# Patient Record
Sex: Female | Born: 1955 | Race: White | Hispanic: No | Marital: Married | Smoking: Never smoker
Health system: Southern US, Community
[De-identification: ages and names within clinical notes are randomized; demographics above are authoritative.]

## PROBLEM LIST (undated history)

## (undated) DIAGNOSIS — H3530 Unspecified macular degeneration: Secondary | ICD-10-CM

## (undated) DIAGNOSIS — F32A Depression, unspecified: Secondary | ICD-10-CM

## (undated) DIAGNOSIS — M1990 Unspecified osteoarthritis, unspecified site: Secondary | ICD-10-CM

## (undated) DIAGNOSIS — H269 Unspecified cataract: Secondary | ICD-10-CM

## (undated) DIAGNOSIS — T7840XA Allergy, unspecified, initial encounter: Secondary | ICD-10-CM

## (undated) DIAGNOSIS — K219 Gastro-esophageal reflux disease without esophagitis: Secondary | ICD-10-CM

## (undated) DIAGNOSIS — Z8619 Personal history of other infectious and parasitic diseases: Secondary | ICD-10-CM

## (undated) HISTORY — DX: Unspecified macular degeneration: H35.30

## (undated) HISTORY — DX: Unspecified cataract: H26.9

## (undated) HISTORY — DX: Depression, unspecified: F32.A

## (undated) HISTORY — PX: EYE SURGERY: SHX253

## (undated) HISTORY — DX: Personal history of other infectious and parasitic diseases: Z86.19

## (undated) HISTORY — DX: Gastro-esophageal reflux disease without esophagitis: K21.9

## (undated) HISTORY — DX: Allergy, unspecified, initial encounter: T78.40XA

## (undated) HISTORY — DX: Unspecified osteoarthritis, unspecified site: M19.90

---

## 1993-03-10 HISTORY — PX: OTHER SURGICAL HISTORY: SHX169

## 1997-03-10 HISTORY — PX: HERNIA REPAIR: SHX51

## 2007-03-11 HISTORY — PX: THROAT SURGERY: SHX803

## 2014-03-10 DIAGNOSIS — H3530 Unspecified macular degeneration: Secondary | ICD-10-CM

## 2014-03-10 HISTORY — DX: Unspecified macular degeneration: H35.30

## 2014-07-14 LAB — HM COLONOSCOPY

## 2014-11-23 ENCOUNTER — Encounter: Payer: Self-pay | Admitting: Physician Assistant

## 2014-12-06 ENCOUNTER — Encounter: Payer: Self-pay | Admitting: Physician Assistant

## 2015-07-25 ENCOUNTER — Encounter: Payer: Self-pay | Admitting: Physician Assistant

## 2016-08-21 DIAGNOSIS — Z13220 Encounter for screening for lipoid disorders: Secondary | ICD-10-CM | POA: Diagnosis not present

## 2016-08-21 DIAGNOSIS — R350 Frequency of micturition: Secondary | ICD-10-CM | POA: Diagnosis not present

## 2016-08-21 DIAGNOSIS — Z0000 Encounter for general adult medical examination without abnormal findings: Secondary | ICD-10-CM | POA: Diagnosis not present

## 2016-08-21 LAB — BASIC METABOLIC PANEL
BUN: 13 (ref 4–21)
Creatinine: 0.8 (ref 0.5–1.1)
GLUCOSE: 93
Potassium: 4.2 (ref 3.4–5.3)
SODIUM: 143 (ref 137–147)

## 2016-08-21 LAB — HEPATIC FUNCTION PANEL
ALT: 13 (ref 7–35)
AST: 19 (ref 13–35)
Alkaline Phosphatase: 66 (ref 25–125)
BILIRUBIN, TOTAL: 0.6

## 2016-08-21 LAB — LIPID PANEL
Cholesterol: 229 — AB (ref 0–200)
HDL: 76 — AB (ref 35–70)
LDL CALC: 135
TRIGLYCERIDES: 88 (ref 40–160)

## 2016-08-21 LAB — TSH: TSH: 1 (ref 0.41–5.90)

## 2016-08-21 LAB — CBC AND DIFFERENTIAL
HEMATOCRIT: 41 (ref 36–46)
HEMOGLOBIN: 13.8 (ref 12.0–16.0)
Platelets: 239 (ref 150–399)
WBC: 11.2

## 2017-03-18 DIAGNOSIS — L219 Seborrheic dermatitis, unspecified: Secondary | ICD-10-CM | POA: Diagnosis not present

## 2017-07-16 ENCOUNTER — Encounter: Payer: Self-pay | Admitting: *Deleted

## 2017-07-16 ENCOUNTER — Ambulatory Visit: Payer: BLUE CROSS/BLUE SHIELD | Admitting: Physician Assistant

## 2017-07-16 ENCOUNTER — Encounter: Payer: Self-pay | Admitting: Physician Assistant

## 2017-07-16 VITALS — BP 130/80 | HR 66 | Temp 98.6°F | Ht 60.0 in | Wt 131.0 lb

## 2017-07-16 DIAGNOSIS — J302 Other seasonal allergic rhinitis: Secondary | ICD-10-CM | POA: Diagnosis not present

## 2017-07-16 DIAGNOSIS — Z7689 Persons encountering health services in other specified circumstances: Secondary | ICD-10-CM

## 2017-07-16 MED ORDER — FLUTICASONE PROPIONATE 50 MCG/ACT NA SUSP: 2.0000 | Freq: Every day | NASAL | 2 refills | Status: DC

## 2017-07-16 NOTE — Progress Notes (Signed)
Robin Stevens is a 62 y.o. female here to Establish Care.  I acted as a Education administrator for [employer], PA-C Anselmo Pickler, LPN  History of Present Illness:   Chief Complaint  Patient presents with  . Establish Care    BC/BS  . Rash and welts    off and on since Saturday    Acute Concerns: Rash -- developed rash over the weekend, was out in her yard and had hives on her bilateral arms and torso. Started an antihistamine which is helping -- taking claritin daily. Rash no longer present. Does continue to have some itching in nose/throat/ears. Denies tongue/lip swelling, SOB or difficulty swallowing.  Health Maintenance: Immunizations -- requesting records Colonoscopy -- done 2017 Normal per pt Mammogram -- done 2017 Normal per pt PAP -- most recent 2016 Bone Density -- has not had Weight -- Weight: 131 lb (59.4 kg)   Depression screen PHQ 2/9 07/16/2017  Decreased Interest 0  Down, Depressed, Hopeless 0  PHQ - 2 Score 0    No flowsheet data found.  Other providers/specialists: Dr. Macarthur Critchley -- eye Dr. Wyline Beady -- dentist  Past Medical History:  Diagnosis Date  . GERD (gastroesophageal reflux disease)   . History of chicken pox   . Macular degeneration of both eyes 2016     Social History   Socioeconomic History  . Marital status: Married    Spouse name: Not on file  . Number of children: Not on file  . Years of education: Not on file  . Highest education level: Not on file  Occupational History  . Not on file  Social Needs  . Financial resource strain: Not on file  . Food insecurity:    Worry: Not on file    Inability: Not on file  . Transportation needs:    Medical: Not on file    Non-medical: Not on file  Tobacco Use  . Smoking status: Never Smoker  . Smokeless tobacco: Never Used  Substance and Sexual Activity  . Alcohol use: Yes    Comment: socially  . Drug use: Never  . Sexual activity: Yes  Lifestyle  . Physical activity:    Days per week: Not  on file    Minutes per session: Not on file  . Stress: Not on file  Relationships  . Social connections:    Talks on phone: Not on file    Gets together: Not on file    Attends religious service: Not on file    Active member of club or organization: Not on file    Attends meetings of clubs or organizations: Not on file    Relationship status: Not on file  . Intimate partner violence:    Fear of current or ex partner: Not on file    Emotionally abused: Not on file    Physically abused: Not on file    Forced sexual activity: Not on file  Other Topics Concern  . 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 HERNIA REPAIR Bilateral 1999   Inguinal   . removal of ovarian cyst Right 1995  . 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 VAGINAL DELIVERY  1985, 1987, 1989    Family History  Problem Relation Age of Onset  .  Arthritis Mother   . Heart attack Mother   . Heart disease Mother   . Pancreatic cancer Father   . Early death Maternal Grandmother 40       cardiac-related    Allergies  Allergen Reactions  . Sulfa Antibiotics Nausea Only     Current Medications:   Current Outpatient Medications:  .  B Complex-C (SUPER B COMPLEX PO), Take 1 tablet by mouth daily., Disp: , Rfl:  .  calcium citrate-vitamin D (CITRACAL+D) 315-200 MG-UNIT tablet, Take 1 tablet by mouth daily., Disp: , Rfl:  .  Cranberry 450 MG TABS, Take 1 tablet by mouth every other day., Disp: , Rfl:  .  Dietary Management Product (TOZAL PO), Take 1 tablet by mouth daily., Disp: , Rfl:  .  GLUCOSAMINE HCL PO, Take 1 tablet by mouth 2 (two) times daily., Disp: , Rfl:  .  ibuprofen (ADVIL,MOTRIN) 200 MG tablet, Take 200 mg by mouth as needed., Disp: , Rfl:  .  loratadine (CLARITIN) 10 MG tablet, Take 10 mg by mouth daily., Disp: ,  Rfl:  .  Magnesium 400 MG TABS, Take 1 tablet by mouth daily., Disp: , Rfl:  .  fluticasone (FLONASE) 50 MCG/ACT nasal spray, Place 2 sprays into both nostrils daily., Disp: 16 g, Rfl: 2   Review of Systems:   Review of Systems  Constitutional: Negative.  Negative for chills, fever, malaise/fatigue and weight loss.  HENT: Negative.  Negative for hearing loss, sinus pain and sore throat.   Eyes: Negative.  Negative for blurred vision.  Respiratory: Negative.  Negative for cough and shortness of breath.   Cardiovascular: Negative.  Negative for chest pain, palpitations and leg swelling.  Gastrointestinal: Positive for heartburn. Negative for abdominal pain, constipation, diarrhea, nausea and vomiting.  Genitourinary: Negative.  Negative for dysuria, frequency and urgency.  Musculoskeletal: Negative.  Negative for back pain, myalgias and neck pain.  Skin: Positive for itching and rash.  Neurological: Negative.  Negative for dizziness, tingling, seizures, loss of consciousness and headaches.  Endo/Heme/Allergies: Negative.  Negative for polydipsia.  Psychiatric/Behavioral: Negative.  Negative for depression. The patient is not nervous/anxious.     Vitals:   Vitals:   07/16/17 0812  BP: 130/80  Pulse: 66  Temp: 98.6 F (37 C)  TempSrc: Oral  SpO2: 99%  Weight: 131 lb (59.4 kg)  Height: 5' (1.524 m)     Body mass index is 25.58 kg/m.  Physical Exam:   Physical Exam  Constitutional: She appears well-developed. She is cooperative.  Non-toxic appearance. She does not have a sickly appearance. She does not appear ill. No distress.  HENT:  Head: Normocephalic and atraumatic.  Right Ear: Tympanic membrane, external ear and ear canal normal. Tympanic membrane is not erythematous, not retracted and not bulging.  Left Ear: Tympanic membrane, external ear and ear canal normal. Tympanic membrane is not erythematous, not retracted and not bulging.  Nose: Mucosal edema and rhinorrhea  present. Right sinus exhibits no maxillary sinus tenderness and no frontal sinus tenderness. Left sinus exhibits no maxillary sinus tenderness and no frontal sinus tenderness.  Mouth/Throat: Uvula is midline and mucous membranes are normal. No posterior oropharyngeal edema or posterior oropharyngeal erythema. Tonsils are 0 on the right. Tonsils are 0 on the left.  Eyes: Conjunctivae and lids are normal.  Neck: Trachea normal.  Cardiovascular: Normal rate, regular rhythm, S1 normal, S2 normal and normal heart sounds.  Pulmonary/Chest: Effort normal and breath sounds normal. She has no decreased breath sounds. She  has no wheezes. She has no rhonchi. She has no rales.  Lymphadenopathy:    She has no cervical adenopathy.  Neurological: She is alert.  Skin: Skin is warm, dry and intact.  No evidence of rash present  Psychiatric: She has a normal mood and affect. Her speech is normal and behavior is normal.  Nursing note and vitals reviewed.   Assessment and Plan:    Daniella was seen today for establish care and rash and welts.  Diagnoses and all orders for this visit:  Encounter to establish care Follow-up for physical at her convenience.  Seasonal allergies No red flags on exam. Continue daily antihistamine of choice. Start scheduled AM and PM flonase. Follow-up if symptoms worsen or persist despite treatment.  Other orders -     fluticasone (FLONASE) 50 MCG/ACT nasal spray; Place 2 sprays into both nostrils daily.    . Reviewed expectations re: course of current medical issues. . Discussed self-management of symptoms. . Outlined signs and symptoms indicating need for more acute intervention. . Patient verbalized understanding and all questions were answered. . See orders for this visit as documented in the electronic medical record. . Patient received an After-Visit Summary.   CMA or LPN served as scribe during this visit. History, Physical, and Plan performed by medical provider.  Documentation and orders reviewed and attested to.   Inda Coke, PA-C

## 2017-07-16 NOTE — Patient Instructions (Signed)
It was great to meet you!  Take antihistamine of your choice daily.  Use flonase in AM and PM.  Follow-up at your convenience for a physical.

## 2017-08-17 ENCOUNTER — Other Ambulatory Visit (HOSPITAL_COMMUNITY)
Admission: RE | Admit: 2017-08-17 | Discharge: 2017-08-17 | Disposition: A | Discharge status: Home / Self Care | Payer: BLUE CROSS/BLUE SHIELD | Source: Ambulatory Visit | Attending: Physician Assistant | Admitting: Physician Assistant

## 2017-08-17 ENCOUNTER — Encounter: Payer: Self-pay | Admitting: Physician Assistant

## 2017-08-17 ENCOUNTER — Ambulatory Visit (INDEPENDENT_AMBULATORY_CARE_PROVIDER_SITE_OTHER): Payer: BLUE CROSS/BLUE SHIELD | Admitting: Physician Assistant

## 2017-08-17 VITALS — BP 136/90 | HR 67 | Temp 98.0°F | Ht 60.0 in | Wt 128.2 lb

## 2017-08-17 DIAGNOSIS — Z114 Encounter for screening for human immunodeficiency virus [HIV]: Secondary | ICD-10-CM

## 2017-08-17 DIAGNOSIS — L84 Corns and callosities: Secondary | ICD-10-CM

## 2017-08-17 DIAGNOSIS — Z136 Encounter for screening for cardiovascular disorders: Secondary | ICD-10-CM

## 2017-08-17 DIAGNOSIS — M25542 Pain in joints of left hand: Secondary | ICD-10-CM | POA: Diagnosis not present

## 2017-08-17 DIAGNOSIS — Z1151 Encounter for screening for human papillomavirus (HPV): Secondary | ICD-10-CM | POA: Diagnosis not present

## 2017-08-17 DIAGNOSIS — Z13220 Encounter for screening for lipoid disorders: Secondary | ICD-10-CM

## 2017-08-17 DIAGNOSIS — Z124 Encounter for screening for malignant neoplasm of cervix: Secondary | ICD-10-CM

## 2017-08-17 DIAGNOSIS — Z0001 Encounter for general adult medical examination with abnormal findings: Secondary | ICD-10-CM | POA: Diagnosis not present

## 2017-08-17 DIAGNOSIS — M25541 Pain in joints of right hand: Secondary | ICD-10-CM

## 2017-08-17 DIAGNOSIS — Z1159 Encounter for screening for other viral diseases: Secondary | ICD-10-CM | POA: Diagnosis not present

## 2017-08-17 DIAGNOSIS — E559 Vitamin D deficiency, unspecified: Secondary | ICD-10-CM | POA: Diagnosis not present

## 2017-08-17 LAB — COMPREHENSIVE METABOLIC PANEL
ALBUMIN: 4.2 g/dL (ref 3.5–5.2)
ALK PHOS: 59 U/L (ref 39–117)
ALT: 12 U/L (ref 0–35)
AST: 20 U/L (ref 0–37)
BUN: 11 mg/dL (ref 6–23)
CALCIUM: 9.4 mg/dL (ref 8.4–10.5)
CHLORIDE: 104 meq/L (ref 96–112)
CO2: 31 mEq/L (ref 19–32)
Creatinine, Ser: 0.77 mg/dL (ref 0.40–1.20)
GFR: 81.13 mL/min (ref >60.00)
Glucose, Bld: 92 mg/dL (ref 70–99)
POTASSIUM: 4.1 meq/L (ref 3.5–5.1)
Sodium: 142 mEq/L (ref 135–145)
TOTAL PROTEIN: 6.8 g/dL (ref 6.0–8.3)
Total Bilirubin: 0.5 mg/dL (ref 0.2–1.2)

## 2017-08-17 LAB — VITAMIN D 25 HYDROXY (VIT D DEFICIENCY, FRACTURES): VITD: 34.6 ng/mL (ref 30.00–100.00)

## 2017-08-17 LAB — LIPID PANEL
CHOL/HDL RATIO: 3
Cholesterol: 240 mg/dL — ABNORMAL HIGH (ref 0–200)
HDL: 73.9 mg/dL (ref >39.00)
LDL CALC: 153 mg/dL — AB (ref 0–99)
NonHDL: 166.2
TRIGLYCERIDES: 65 mg/dL (ref 0.0–149.0)
VLDL: 13 mg/dL (ref 0.0–40.0)

## 2017-08-17 LAB — CBC WITH DIFFERENTIAL/PLATELET
BASOS PCT: 0.7 % (ref 0.0–3.0)
Basophils Absolute: 0 10*3/uL (ref 0.0–0.1)
EOS PCT: 2.1 % (ref 0.0–5.0)
Eosinophils Absolute: 0.1 10*3/uL (ref 0.0–0.7)
HEMATOCRIT: 41.3 % (ref 36.0–46.0)
HEMOGLOBIN: 13.7 g/dL (ref 12.0–15.0)
Lymphocytes Relative: 32.8 % (ref 12.0–46.0)
Lymphs Abs: 1.5 10*3/uL (ref 0.7–4.0)
MCHC: 33.1 g/dL (ref 30.0–36.0)
MCV: 87.6 fl (ref 78.0–100.0)
MONOS PCT: 9.2 % (ref 3.0–12.0)
Monocytes Absolute: 0.4 10*3/uL (ref 0.1–1.0)
Neutro Abs: 2.5 10*3/uL (ref 1.4–7.7)
Neutrophils Relative %: 55.2 % (ref 43.0–77.0)
Platelets: 247 10*3/uL (ref 150.0–400.0)
RBC: 4.72 Mil/uL (ref 3.87–5.11)
RDW: 13.8 % (ref 11.5–15.5)
WBC: 4.6 10*3/uL (ref 4.0–10.5)

## 2017-08-17 NOTE — Patient Instructions (Signed)
It was great to meet you!  Please let us know if you would like further work-up of your hand/thumb pain.  If a referral was placed today (podiatry), you will be contacted for an appointment. Please note that routine referrals can sometimes take up to 3-4 weeks to process. Please call our office if you haven't heard anything after this time frame.  After speaking with the physician, I do not think that your insurance will pay for a DEXA scan until age 61. If you would like to pay out of pocket for this, let me know.  Health Maintenance, Female Adopting a healthy lifestyle and getting preventive care can go a long way to promote health and wellness. Talk with your health care provider about what schedule of regular examinations is right for you. This is a good chance for you to check in with your provider about disease prevention and staying healthy. In between checkups, there are plenty of things you can do on your own. Experts have done a lot of research about which lifestyle changes and preventive measures are most likely to keep you healthy. Ask your health care provider for more information. Weight and diet Eat a healthy diet  Be sure to include plenty of vegetables, fruits, low-fat dairy products, and lean protein.  Do not eat a lot of foods high in solid fats, added sugars, or salt.  Get regular exercise. This is one of the most important things you can do for your health. ? Most adults should exercise for at least 150 minutes each week. The exercise should increase your heart rate and make you sweat (moderate-intensity exercise). ? Most adults should also do strengthening exercises at least twice a week. This is in addition to the moderate-intensity exercise.  Maintain a healthy weight  Body mass index (BMI) is a measurement that can be used to identify possible weight problems. It estimates body fat based on height and weight. Your health care provider can help determine your BMI and  help you achieve or maintain a healthy weight.  For females 55 years of age and older: ? A BMI below 18.5 is considered underweight. ? A BMI of 18.5 to 24.9 is normal. ? A BMI of 25 to 29.9 is considered overweight. ? A BMI of 30 and above is considered obese.  Watch levels of cholesterol and blood lipids  You should start having your blood tested for lipids and cholesterol at 61 years of age, then have this test every 5 years.  You may need to have your cholesterol levels checked more often if: ? Your lipid or cholesterol levels are high. ? You are older than 61 years of age. ? You are at high risk for heart disease.  Cancer screening Lung Cancer  Lung cancer screening is recommended for adults 75-68 years old who are at high risk for lung cancer because of a history of smoking.  A yearly low-dose CT scan of the lungs is recommended for people who: ? Currently smoke. ? Have quit within the past 15 years. ? Have at least a 30-pack-year history of smoking. A pack year is smoking an average of one pack of cigarettes a day for 1 year.  Yearly screening should continue until it has been 15 years since you quit.  Yearly screening should stop if you develop a health problem that would prevent you from having lung cancer treatment.  Breast Cancer  Practice breast self-awareness. This means understanding how your breasts normally appear and feel.  feel.  It also means doing regular breast self-exams. Let your health care provider know about any changes, no matter how small.  If you are in your 20s or 30s, you should have a clinical breast exam (CBE) by a health care provider every 1-3 years as part of a regular health exam.  If you are 40 or older, have a CBE every year. Also consider having a breast X-ray (mammogram) every year.  If you have a family history of breast cancer, talk to your health care provider about genetic screening.  If you are at high risk for breast cancer, talk to  your health care provider about having an MRI and a mammogram every year.  Breast cancer gene (BRCA) assessment is recommended for women who have family members with BRCA-related cancers. BRCA-related cancers include: ? Breast. ? Ovarian. ? Tubal. ? Peritoneal cancers.  Results of the assessment will determine the need for genetic counseling and BRCA1 and BRCA2 testing.  Cervical Cancer Your health care provider may recommend that you be screened regularly for cancer of the pelvic organs (ovaries, uterus, and vagina). This screening involves a pelvic examination, including checking for microscopic changes to the surface of your cervix (Pap test). You may be encouraged to have this screening done every 3 years, beginning at age 21.  For women ages 30-65, health care providers may recommend pelvic exams and Pap testing every 3 years, or they may recommend the Pap and pelvic exam, combined with testing for human papilloma virus (HPV), every 5 years. Some types of HPV increase your risk of cervical cancer. Testing for HPV may also be done on women of any age with unclear Pap test results.  Other health care providers may not recommend any screening for nonpregnant women who are considered low risk for pelvic cancer and who do not have symptoms. Ask your health care provider if a screening pelvic exam is right for you.  If you have had past treatment for cervical cancer or a condition that could lead to cancer, you need Pap tests and screening for cancer for at least 20 years after your treatment. If Pap tests have been discontinued, your risk factors (such as having a new sexual partner) need to be reassessed to determine if screening should resume. Some women have medical problems that increase the chance of getting cervical cancer. In these cases, your health care provider may recommend more frequent screening and Pap tests.  Colorectal Cancer  This type of cancer can be detected and often  prevented.  Routine colorectal cancer screening usually begins at 61 years of age and continues through 61 years of age.  Your health care provider may recommend screening at an earlier age if you have risk factors for colon cancer.  Your health care provider may also recommend using home test kits to check for hidden blood in the stool.  A small camera at the end of a tube can be used to examine your colon directly (sigmoidoscopy or colonoscopy). This is done to check for the earliest forms of colorectal cancer.  Routine screening usually begins at age 50.  Direct examination of the colon should be repeated every 5-10 years through 61 years of age. However, you may need to be screened more often if early forms of precancerous polyps or small growths are found.  Skin Cancer  Check your skin from head to toe regularly.  Tell your health care provider about any new moles or changes in moles, especially if there   change in a mole's shape or color.  Also tell your health care provider if you have a mole that is larger than the size of a pencil eraser.  Always use sunscreen. Apply sunscreen liberally and repeatedly throughout the day.  Protect yourself by wearing long sleeves, pants, a wide-brimmed hat, and sunglasses whenever you are outside.  Heart disease, diabetes, and high blood pressure  High blood pressure causes heart disease and increases the risk of stroke. High blood pressure is more likely to develop in: ? People who have blood pressure in the high end of the normal range (130-139/85-89 mm Hg). ? People who are overweight or obese. ? People who are African American.  If you are 79-13 years of age, have your blood pressure checked every 3-5 years. If you are 37 years of age or older, have your blood pressure checked every year. You should have your blood pressure measured twice-once when you are at a hospital or clinic, and once when you are not at a hospital or clinic.  Record the average of the two measurements. To check your blood pressure when you are not at a hospital or clinic, you can use: ? An automated blood pressure machine at a pharmacy. ? A home blood pressure monitor.  If you are between 71 years and 51 years old, ask your health care provider if you should take aspirin to prevent strokes.  Have regular diabetes screenings. This involves taking a blood sample to check your fasting blood sugar level. ? If you are at a normal weight and have a low risk for diabetes, have this test once every three years after 61 years of age. ? If you are overweight and have a high risk for diabetes, consider being tested at a younger age or more often. Preventing infection Hepatitis B  If you have a higher risk for hepatitis B, you should be screened for this virus. You are considered at high risk for hepatitis B if: ? You were born in a country where hepatitis B is common. Ask your health care provider which countries are considered high risk. ? Your parents were born in a high-risk country, and you have not been immunized against hepatitis B (hepatitis B vaccine). ? You have HIV or AIDS. ? You use needles to inject street drugs. ? You live with someone who has hepatitis B. ? You have had sex with someone who has hepatitis B. ? You get hemodialysis treatment. ? You take certain medicines for conditions, including cancer, organ transplantation, and autoimmune conditions.  Hepatitis C  Blood testing is recommended for: ? Everyone born from [birth year] through 1964. ? Anyone with known risk factors for hepatitis C.  Sexually transmitted infections (STIs)  You should be screened for sexually transmitted infections (STIs) including gonorrhea and chlamydia if: ? You are sexually active and are younger than 61 years of age. ? You are older than 61 years of age and your health care provider tells you that you are at risk for this type of infection. ? Your sexual  activity has changed since you were last screened and you are at an increased risk for chlamydia or gonorrhea. Ask your health care provider if you are at risk.  If you do not have HIV, but are at risk, it may be recommended that you take a prescription medicine daily to prevent HIV infection. This is called pre-exposure prophylaxis (PrEP). You are considered at risk if: ? You are sexually active and do not regularly  use condoms or know the HIV status of your partner(s). ? You take drugs by injection. ? You are sexually active with a partner who has HIV.  Talk with your health care provider about whether you are at high risk of being infected with HIV. If you choose to begin PrEP, you should first be tested for HIV. You should then be tested every 3 months for as long as you are taking PrEP. Pregnancy  If you are premenopausal and you may become pregnant, ask your health care provider about preconception counseling.  If you may become pregnant, take 400 to 800 micrograms (mcg) of folic acid every day.  If you want to prevent pregnancy, talk to your health care provider about birth control (contraception). Osteoporosis and menopause  Osteoporosis is a disease in which the bones lose minerals and strength with aging. This can result in serious bone fractures. Your risk for osteoporosis can be identified using a bone density scan.  If you are 8 years of age or older, or if you are at risk for osteoporosis and fractures, ask your health care provider if you should be screened.  Ask your health care provider whether you should take a calcium or vitamin D supplement to lower your risk for osteoporosis.  Menopause may have certain physical symptoms and risks.  Hormone replacement therapy may reduce some of these symptoms and risks. Talk to your health care provider about whether hormone replacement therapy is right for you. Follow these instructions at home:  Schedule regular health, dental,  and eye exams.  Stay current with your immunizations.  Do not use any tobacco products including cigarettes, chewing tobacco, or electronic cigarettes.  If you are pregnant, do not drink alcohol.  If you are breastfeeding, limit how much and how often you drink alcohol.  Limit alcohol intake to no more than 1 drink per day for nonpregnant women. One drink equals 12 ounces of beer, 5 ounces of wine, or 1 ounces of hard liquor.  Do not use street drugs.  Do not share needles.  Ask your health care provider for help if you need support or information about quitting drugs.  Tell your health care provider if you often feel depressed.  Tell your health care provider if you have ever been abused or do not feel safe at home. This information is not intended to replace advice given to you by your health care provider. Make sure you discuss any questions you have with your health care provider. Document Released: 09/09/2010 Document Revised: 08/02/2015 Document Reviewed: 11/28/2014 Elsevier Interactive Patient Education  [employer].

## 2017-08-17 NOTE — Progress Notes (Signed)
I acted as a Education administrator for [employer], PA-C Anselmo Pickler, LPN   Subjective:    Robin Stevens is a 62 y.o. female and is here for a comprehensive physical exam.   HPI  Health Maintenance Due  Topic Date Due  . Hepatitis C Screening  04-May-1955  . HIV Screening  02/03/1972  . PAP SMEAR  02/02/1978  . MAMMOGRAM  02/03/2007  . COLONOSCOPY  02/03/2007    Acute Concerns: Bilateral hand pain -- patient reports that she has had ongoing issues for a few years with pain in both of her hands, especially in the base of her thumbs. Does have a strong family history of RA. She has noticed some swollen joints as well, unable to get her wedding ring off due to swelling in her ring finger joint. Symptoms are not so severe that she feels like she needs to take medication. Corn on R foot -- has occasional pain, wore strappy sandals at her daughters wedding for the all day event and since then has had a deformity to her R pinky toe with occasional pain, is interested in seeing podiatry  Chronic Issues: Vitamin D deficiency -- no family history of osteopenia/osteoporosis; has not had any recent or history of multiple fractures. She takes calcium and vit D supplement daily. Also does weight bearing exercise.  Health Maintenance: Immunizations -- UTD Colonoscopy -- done 2017, will obtain records, normal per pt. due 2027 Mammogram -- done 2017 Normal per pt,  PAP -- done today, last was done 2016 normal per pt. No Hx of abnormal Bone Density -- Never Diet -- well balanced Caffeine intake -- 2 cups daily Sleep habits -- doesn't usually have any issues Exercise -- 3 mornings a week --> yoga; 2 mornings a week --> strength training; tries to walk daily at lunch Weight -- Weight: 128 lb 4 oz (58.2 kg)  Mood -- has had situational anxiety and depression; was on Lexapro at one point after MIL passed away Weight history: Wt Readings from Last 10 Encounters:  08/17/17 128 lb 4 oz (58.2 kg)  07/16/17  131 lb (59.4 kg)   No LMP recorded. Patient is postmenopausal. Alcohol use: 1 glass nightly Tobacco use: none  Depression screen PHQ 2/9 08/17/2017  Decreased Interest 0  Down, Depressed, Hopeless 0  PHQ - 2 Score 0    Other providers/specialists: UTD with dentist and eye doctor   PMHx, SurgHx, SocialHx, Medications, and Allergies were reviewed in the Visit Navigator and updated as appropriate.   Past Medical History:  Diagnosis Date  . GERD (gastroesophageal reflux disease)   . History of chicken pox   . Macular degeneration of both eyes 2016     Past Surgical History:  Procedure Laterality Date  . HERNIA REPAIR Bilateral 1999   Inguinal   . removal of ovarian cyst Right 1995  . 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 VAGINAL DELIVERY  1985, 1987, 1989     Family History  Problem Relation Age of Onset  . Arthritis Mother   . Heart attack Mother   . Heart disease Mother   . Pancreatic cancer Father   . Early death Maternal Grandmother 40       cardiac-related  . Heart disease Maternal Grandfather     Social History   Tobacco Use  . Smoking status: Never Smoker  . Smokeless tobacco: Never Used  Substance Use Topics  . Alcohol use: Yes    Comment: socially  .  Drug use: Never    Review of Systems:   Review of Systems  Constitutional: Negative.  Negative for chills, fever, malaise/fatigue and weight loss.  HENT: Negative.  Negative for hearing loss, sinus pain and sore throat.   Eyes: Negative.  Negative for blurred vision.  Respiratory: Negative.  Negative for cough and shortness of breath.   Cardiovascular: Negative.  Negative for chest pain, palpitations and leg swelling.  Gastrointestinal: Negative.  Negative for abdominal pain, constipation, diarrhea, heartburn, nausea and vomiting.  Genitourinary: Negative.  Negative for dysuria, frequency and urgency.  Musculoskeletal: Negative.  Negative for back pain, myalgias and neck pain.  Skin:  Negative.  Negative for itching and rash.  Neurological: Negative.  Negative for dizziness, tingling, seizures, loss of consciousness and headaches.  Endo/Heme/Allergies: Negative.  Negative for polydipsia.  Psychiatric/Behavioral: Negative.  Negative for depression. The patient is not nervous/anxious.     Objective:   BP 136/90 (BP Location: Left Arm, Patient Position: Sitting, Cuff Size: Normal)   Pulse 67   Temp 98 F (36.7 C) (Oral)   Ht 5' (1.524 m)   Wt 128 lb 4 oz (58.2 kg)   SpO2 97%   BMI 25.05 kg/m  Body mass index is 25.05 kg/m.   General Appearance:    Alert, cooperative, no distress, appears stated age  Head:    Normocephalic, without obvious abnormality, atraumatic  Eyes:    PERRL, conjunctiva/corneas clear, EOM's intact, fundi    benign, both eyes  Ears:    Normal TM's and external ear canals, both ears  Nose:   Nares normal, septum midline, mucosa normal, no drainage    or sinus tenderness  Throat:   Lips, mucosa, and tongue normal; teeth and gums normal  Neck:   Supple, symmetrical, trachea midline, no adenopathy;    thyroid:  no enlargement/tenderness/nodules; no carotid   bruit or JVD  Back:     Symmetric, no curvature, ROM normal, no CVA tenderness  Lungs:     Clear to auscultation bilaterally, respirations unlabored  Chest Wall:    No tenderness or deformity   Heart:    Regular rate and rhythm, S1 and S2 normal, no murmur, rub   or gallop  Breast Exam:    No tenderness, masses, or nipple abnormality  Abdomen:     Soft, non-tender, bowel sounds active all four quadrants,    no masses, no organomegaly  Genitalia:    Normal female without lesion, discharge or tenderness  Extremities:   Extremities normal, atraumatic, no cyanosis or edema;  hyperkeratotic papule with a central "core" on the lateral fifth toe of R foot; negative Finkelstein's bilaterally; nodular PIP joint of L ring finger -- nontender/non-erythematous/not warm  Pulses:   2+ and symmetric all  extremities  Skin:   Skin color, texture, turgor normal, no rashes or lesions  Lymph nodes:   Cervical, supraclavicular, and axillary nodes normal  Neurologic:   CNII-XII intact, normal strength, sensation and reflexes    Throughout; grip strength 5/5 bilaterally     Assessment/Plan:   Natascha was seen today for annual exam.  Diagnoses and all orders for this visit:  Encounter for general adult medical examination with abnormal findings Today patient counseled on age appropriate routine health concerns for screening and prevention, each reviewed and up to date or declined. Immunizations reviewed and up to date or declined. Labs ordered and reviewed. Risk factors for depression reviewed and negative. Hearing function and visual acuity are intact. ADLs screened and addressed as  needed. Functional ability and level of safety reviewed and appropriate. Education, counseling and referrals performed based on assessed risks today. Patient provided with a copy of personalized plan for preventive services.  Vitamin D deficiency Continue calcium and vit D supplement. Will check Vit D level today. Continue weight-bearing activities. She would like a DEXA -- I discussed with Dr. Briscoe Deutscher and after reviewing patient's information, I do not suspect that her insurance will cover this for her until 18. I offered to order for patient however I discussed that it would like cost money out of pocket, patient declined at this time. Will defer to age 60, or sooner should alternative circumstances arise. -     VITAMIN D 25 Hydroxy (Vit-D Deficiency, Fractures)  Arthralgia of both hands Discussed possible [facility] arthritis. Offered referral to [place], however she declined at this time. Offered autoimmune labs, however she declined at this time as well. Follow-up if symptoms worsen or persist or if she would like to pursue these options. -     CBC with Differential/Platelet -     Comprehensive metabolic  panel  Encounter for lipid screening for cardiovascular disease -     Lipid panel  Screening for HIV (human immunodeficiency virus) -     HIV antibody  Encounter for screening for other viral diseases -     Hepatitis C antibody  Corn or callus She would like a podiatry referral. I have put this in today.  Pap smear for cervical cancer screening Performed today. She declined STI testing. -     Cytology - PAP    Well Adult Exam: Labs ordered: Yes. Patient counseling was done. See below for items discussed. Discussed the patient's BMI.  The BMI BMI is in the acceptable range Follow up next physical in 1 year. Breast cancer screening: patient to schedule. Cervical cancer screening: performed today   Patient Counseling: [x]    Nutrition: Stressed importance of moderation in sodium/caffeine intake, saturated fat and cholesterol, caloric balance, sufficient intake of fresh fruits, vegetables, fiber, calcium, iron, and 1 mg of folate supplement per day (for females capable of pregnancy).  [x]    Stressed the importance of regular exercise.   [x]    Substance Abuse: Discussed cessation/primary prevention of tobacco, alcohol, or other drug use; driving or other dangerous activities under the influence; availability of treatment for abuse.   [x]    Injury prevention: Discussed safety belts, safety helmets, smoke detector, smoking near bedding or upholstery.   []    Sexuality: Discussed sexually transmitted diseases, partner selection, use of condoms, avoidance of unintended pregnancy  and contraceptive alternatives.  [x]    Dental health: Discussed importance of regular tooth brushing, flossing, and dental visits.  [x]    Health maintenance and immunizations reviewed. Please refer to Health maintenance section.   CMA or LPN served as scribe during this visit. History, Physical, and Plan performed by medical provider. Documentation and orders reviewed and attested to.   Inda Coke, PA-C [place]

## 2017-08-18 LAB — CYTOLOGY - PAP
Diagnosis: NEGATIVE
HPV: NOT DETECTED

## 2017-08-18 LAB — HIV ANTIBODY (ROUTINE TESTING W REFLEX): HIV 1&2 Ab, 4th Generation: NONREACTIVE

## 2017-08-18 LAB — HEPATITIS C ANTIBODY
HEP C AB: NONREACTIVE
SIGNAL TO CUT-OFF: 0.01 (ref <1.00)

## 2017-08-19 NOTE — Progress Notes (Signed)
Please call patient and let them know that most of the labwork that we completed came back normal, including kidney, liver, blood sugar, infection count, HIV, Hep C and vit D levels. PAP smear is normal.  Cholesterol is elevated. I don't have old records to see how this is trending. It is not at the point that we need to start medication. Continue to work on diet and exercise. We will re-check next year.

## 2017-08-20 ENCOUNTER — Telehealth: Payer: Self-pay | Admitting: Physician Assistant

## 2017-08-20 NOTE — Telephone Encounter (Signed)
Left message to return call- lab in result notes

## 2017-08-20 NOTE — Telephone Encounter (Signed)
Copied from [place] [phone] [phone]. Topic: Quick Communication - Lab Results >> Aug 19, 2017  9:55 AM Marian Sorrow, LPN wrote: Called patient to inform them of 08/17/2017 lab results. When patient returns call, triage nurse may disclose results.

## 2017-08-26 ENCOUNTER — Encounter: Payer: Self-pay | Admitting: Physician Assistant

## 2017-09-04 ENCOUNTER — Ambulatory Visit: Payer: BLUE CROSS/BLUE SHIELD | Admitting: Podiatry

## 2017-09-04 ENCOUNTER — Encounter: Payer: Self-pay | Admitting: Podiatry

## 2017-09-04 ENCOUNTER — Ambulatory Visit (INDEPENDENT_AMBULATORY_CARE_PROVIDER_SITE_OTHER): Payer: BLUE CROSS/BLUE SHIELD

## 2017-09-04 VITALS — BP 129/89 | HR 67

## 2017-09-04 DIAGNOSIS — M2041 Other hammer toe(s) (acquired), right foot: Secondary | ICD-10-CM

## 2017-09-04 DIAGNOSIS — D169 Benign neoplasm of bone and articular cartilage, unspecified: Secondary | ICD-10-CM | POA: Diagnosis not present

## 2017-09-04 NOTE — Progress Notes (Signed)
Subjective:   Patient ID: Robin Stevens, female   DOB: 62 y.o.   MRN: [id number]   HPI Patient presents with chronic lesion on the outside of the right fifth digit inside of the right fifth digit and states she had a tremor in the past with temporary results.  She never has had any bone surgery but states that she knows at one point she may have to have this toe   Review of Systems  All other systems reviewed and are negative.       Objective:  Physical Exam  Constitutional: She appears well-developed and well-nourished.  Cardiovascular: Intact distal pulses.  Pulmonary/Chest: Effort normal.  Musculoskeletal: Normal range of motion.  Neurological: She is alert.  Skin: Skin is warm.  Nursing note and vitals reviewed.   Neurovascular status found to be intact with muscle strength adequate range of motion within normal limits with patient found to have severe rotation fifth digit right with distal lateral keratotic lesion and lesion on the inside of the right fifth toe that is painful when pressed.  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  Today I applied padding to take pressure off the toe and explained the usage of this  X-ray indicates that there is significant rotation of the toe and some wiggling of the distal phalanx right fifth toe

## 2017-09-18 ENCOUNTER — Encounter: Payer: Self-pay | Admitting: Physician Assistant

## 2017-10-09 ENCOUNTER — Telehealth: Payer: Self-pay | Admitting: Physician Assistant

## 2017-10-09 NOTE — Telephone Encounter (Signed)
Our office received records for the patient via CD from [facility]. We are unable to view any of the information or print due to the format. I spoke with Butch Penny who advised that she would contact the patient to have [facility] send her records in hard copy (paper).   Shredding CD as Butch Penny requested.   [facility] number: [phone]

## 2017-10-12 NOTE — Telephone Encounter (Signed)
[facility] and spoke to Medical Records Dept and told her I received records on pt on a disk and we can not read disk I need actual paper records. She said she will send over records.

## 2017-11-27 ENCOUNTER — Other Ambulatory Visit: Payer: Self-pay | Admitting: Physician Assistant

## 2017-11-27 DIAGNOSIS — Z1231 Encounter for screening mammogram for malignant neoplasm of breast: Secondary | ICD-10-CM

## 2017-12-04 ENCOUNTER — Ambulatory Visit: Payer: BLUE CROSS/BLUE SHIELD

## 2017-12-09 ENCOUNTER — Ambulatory Visit (INDEPENDENT_AMBULATORY_CARE_PROVIDER_SITE_OTHER): Payer: BLUE CROSS/BLUE SHIELD

## 2017-12-09 DIAGNOSIS — Z1231 Encounter for screening mammogram for malignant neoplasm of breast: Secondary | ICD-10-CM

## 2018-05-11 ENCOUNTER — Encounter: Payer: Self-pay | Admitting: Physician Assistant

## 2018-08-23 ENCOUNTER — Encounter: Payer: Self-pay | Admitting: Physician Assistant

## 2018-08-23 ENCOUNTER — Other Ambulatory Visit: Payer: Self-pay

## 2018-08-23 ENCOUNTER — Ambulatory Visit (INDEPENDENT_AMBULATORY_CARE_PROVIDER_SITE_OTHER): Payer: BC Managed Care – PPO | Admitting: Physician Assistant

## 2018-08-23 VITALS — BP 122/80 | HR 65 | Temp 98.6°F | Ht 60.0 in | Wt 131.2 lb

## 2018-08-23 DIAGNOSIS — Z0000 Encounter for general adult medical examination without abnormal findings: Secondary | ICD-10-CM | POA: Diagnosis not present

## 2018-08-23 DIAGNOSIS — E559 Vitamin D deficiency, unspecified: Secondary | ICD-10-CM

## 2018-08-23 DIAGNOSIS — J3089 Other allergic rhinitis: Secondary | ICD-10-CM | POA: Diagnosis not present

## 2018-08-23 DIAGNOSIS — E785 Hyperlipidemia, unspecified: Secondary | ICD-10-CM | POA: Diagnosis not present

## 2018-08-23 LAB — COMPREHENSIVE METABOLIC PANEL
ALT: 11 U/L (ref 0–35)
AST: 21 U/L (ref 0–37)
Albumin: 4.2 g/dL (ref 3.5–5.2)
Alkaline Phosphatase: 59 U/L (ref 39–117)
BUN: 14 mg/dL (ref 6–23)
CO2: 28 mEq/L (ref 19–32)
Calcium: 9.1 mg/dL (ref 8.4–10.5)
Chloride: 107 mEq/L (ref 96–112)
Creatinine, Ser: 0.78 mg/dL (ref 0.40–1.20)
GFR: 74.95 mL/min (ref >60.00)
Glucose, Bld: 60 mg/dL — ABNORMAL LOW (ref 70–99)
Potassium: 4.3 mEq/L (ref 3.5–5.1)
Sodium: 143 mEq/L (ref 135–145)
Total Bilirubin: 0.4 mg/dL (ref 0.2–1.2)
Total Protein: 6.4 g/dL (ref 6.0–8.3)

## 2018-08-23 LAB — CBC WITH DIFFERENTIAL/PLATELET
Basophils Absolute: 0 10*3/uL (ref 0.0–0.1)
Basophils Relative: 0.6 % (ref 0.0–3.0)
Eosinophils Absolute: 0.1 10*3/uL (ref 0.0–0.7)
Eosinophils Relative: 1 % (ref 0.0–5.0)
HCT: 39.4 % (ref 36.0–46.0)
Hemoglobin: 12.8 g/dL (ref 12.0–15.0)
Lymphocytes Relative: 31.6 % (ref 12.0–46.0)
Lymphs Abs: 1.5 10*3/uL (ref 0.7–4.0)
MCHC: 32.5 g/dL (ref 30.0–36.0)
MCV: 85.5 fl (ref 78.0–100.0)
Monocytes Absolute: 0.5 10*3/uL (ref 0.1–1.0)
Monocytes Relative: 10.4 % (ref 3.0–12.0)
Neutro Abs: 2.8 10*3/uL (ref 1.4–7.7)
Neutrophils Relative %: 56.4 % (ref 43.0–77.0)
Platelets: 224 10*3/uL (ref 150.0–400.0)
RBC: 4.61 Mil/uL (ref 3.87–5.11)
RDW: 17.9 % — ABNORMAL HIGH (ref 11.5–15.5)
WBC: 4.9 10*3/uL (ref 4.0–10.5)

## 2018-08-23 LAB — LIPID PANEL
Cholesterol: 208 mg/dL — ABNORMAL HIGH (ref 0–200)
HDL: 87.4 mg/dL (ref >39.00)
LDL Cholesterol: 107 mg/dL — ABNORMAL HIGH (ref 0–99)
NonHDL: 120.7
Total CHOL/HDL Ratio: 2
Triglycerides: 68 mg/dL (ref 0.0–149.0)
VLDL: 13.6 mg/dL (ref 0.0–40.0)

## 2018-08-23 LAB — VITAMIN D 25 HYDROXY (VIT D DEFICIENCY, FRACTURES): VITD: 34.39 ng/mL (ref 30.00–100.00)

## 2018-08-23 NOTE — Progress Notes (Signed)
I acted as a Education administrator for [employer], PA-C Anselmo Pickler, LPN   Subjective:    Robin Stevens is a 63 y.o. female and is here for a comprehensive physical exam.   HPI  There are no preventive care reminders to display for this patient.  Acute Concerns: None  Chronic Issues: Hyperlipidemia -- never been on a statin. Strong family hx of heart disease. Due for recheck of lipids today. Allergic rhinitis -- has been on claritin 10 mg since last may. Feels like when she comes off of it she gets rash, poss urticaria.  Vit d deficiency -- takes supplement regularly.   Health Maintenance: Immunizations -- UTD Colonoscopy -- UTD, due 07/2024 Mammogram -- UTD, due 12/2018 PAP -- done 08/17/2017 NILM/Neg HPV, due 08/2020 Bone Density -- N/A Diet -- overall well balanced, enjoys cooking Caffeine intake -- two cups daily Sleep habits -- no issues Exercise -- daily exercise Weight -- Weight: 131 lb 4 oz (59.5 kg)  Mood -- overall good Weight history: Wt Readings from Last 10 Encounters:  08/23/18 131 lb 4 oz (59.5 kg)  08/17/17 128 lb 4 oz (58.2 kg)  07/16/17 131 lb (59.4 kg)   No LMP recorded. Patient is postmenopausal. Alcohol use: daily glass of wine with dinner Tobacco use: none  Depression screen PHQ 2/9 08/23/2018  Decreased Interest 0  Down, Depressed, Hopeless 0  PHQ - 2 Score 0     Other providers/specialists: Patient Care Team: Inda Coke, [place] as PCP - General (Physician Assistant)    PMHx, SurgHx, SocialHx, Medications, and Allergies were reviewed in the Visit Navigator and updated as appropriate.   Past Medical History:  Diagnosis Date  . GERD (gastroesophageal reflux disease)   . History of chicken pox   . Macular degeneration of both eyes 2016     Past Surgical History:  Procedure Laterality Date  . HERNIA REPAIR Bilateral 1999   Inguinal   . removal of ovarian cyst Right 1995  . 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 VAGINAL  DELIVERY  1985, 1987, 1989     Family History  Problem Relation Age of Onset  . Arthritis Mother   . Heart attack Mother   . Heart disease Mother   . Pancreatic cancer Father   . Early death Maternal Grandmother 40       cardiac-related  . Heart disease Maternal Grandfather     Social History   Tobacco Use  . Smoking status: Never Smoker  . Smokeless tobacco: Never Used  Substance Use Topics  . Alcohol use: Yes    Comment: socially  . Drug use: Never    Review of Systems:   Review of Systems  Constitutional: Negative for chills, fever, malaise/fatigue and weight loss.  HENT: Negative for hearing loss, sinus pain and sore throat.   Eyes: Negative for blurred vision.  Respiratory: Negative for cough and shortness of breath.   Cardiovascular: Negative for chest pain, palpitations and leg swelling.  Gastrointestinal: Negative for abdominal pain, constipation, diarrhea, heartburn, nausea and vomiting.  Genitourinary: Negative for dysuria, frequency and urgency.  Musculoskeletal: Negative for back pain, myalgias and neck pain.  Skin: Negative for itching and rash.  Neurological: Negative for dizziness, tingling, seizures, loss of consciousness and headaches.  Endo/Heme/Allergies: Negative for polydipsia.  Psychiatric/Behavioral: Negative for depression. The patient is not nervous/anxious.   All other systems reviewed and are negative.   Objective:   BP 122/80 (BP Location: Left Arm, Patient Position: Sitting,  Cuff Size: Normal)   Pulse 65   Temp 98.6 F (37 C) (Oral)   Ht 5' (1.524 m)   Wt 131 lb 4 oz (59.5 kg)   SpO2 99%   BMI 25.63 kg/m  Body mass index is 25.63 kg/m.   General Appearance:    Alert, cooperative, no distress, appears stated age  Head:    Normocephalic, without obvious abnormality, atraumatic  Eyes:    PERRL, conjunctiva/corneas clear, EOM's intact, fundi    benign, both eyes  Ears:    Normal TM's and external ear canals, both ears  Nose:    Nares normal, septum midline, mucosa normal, no drainage    or sinus tenderness  Throat:   Lips, mucosa, and tongue normal; teeth and gums normal  Neck:   Supple, symmetrical, trachea midline, no adenopathy;    thyroid:  no enlargement/tenderness/nodules; no carotid   bruit or JVD  Back:     Symmetric, no curvature, ROM normal, no CVA tenderness  Lungs:     Clear to auscultation bilaterally, respirations unlabored  Chest Wall:    No tenderness or deformity   Heart:    Regular rate and rhythm, S1 and S2 normal, no murmur, rub or gallop  Breast Exam:    No tenderness, masses, or nipple abnormality  Abdomen:     Soft, non-tender, bowel sounds active all four quadrants,    no masses, no organomegaly  Genitalia:    Deferred  Extremities:   Extremities normal, atraumatic, no cyanosis or edema  Pulses:   2+ and symmetric all extremities  Skin:   Skin color, texture, turgor normal, no rashes or lesions  Lymph nodes:   Cervical, supraclavicular, and axillary nodes normal  Neurologic:   CNII-XII intact, normal strength, sensation and reflexes    throughout     Assessment/Plan:   Robin Stevens was seen today for annual exam.  Diagnoses and all orders for this visit:  Routine physical examination Today patient counseled on age appropriate routine health concerns for screening and prevention, each reviewed and up to date or declined. Immunizations reviewed and up to date or declined. Labs ordered and reviewed. Risk factors for depression reviewed and negative. Hearing function and visual acuity are intact. ADLs screened and addressed as needed. Functional ability and level of safety reviewed and appropriate. Education, counseling and referrals performed based on assessed risks today. Patient provided with a copy of personalized plan for preventive services.  Vitamin D deficiency -     VITAMIN D 25 Hydroxy (Vit-D Deficiency, Fractures)  Hyperlipidemia, unspecified hyperlipidemia type -     CBC with  Differential/Platelet -     Comprehensive metabolic panel -     Lipid panel  Environmental and seasonal allergies Continue Claritin 10 mg daily, we discussed trialing without it. She states that she will, but monitor for return of possible urticaria. Low threshold to resume if helps her rash and symptoms.    Well Adult Exam: Labs ordered: Yes. Patient counseling was done. See below for items discussed. Discussed the patient's BMI.  The BMI is in the acceptable range Follow up in one year. Breast cancer screening: UTD. Cervical cancer screening: UTD   Patient Counseling: [x]    Nutrition: Stressed importance of moderation in sodium/caffeine intake, saturated fat and cholesterol, caloric balance, sufficient intake of fresh fruits, vegetables, fiber, calcium, iron, and 1 mg of folate supplement per day (for females capable of pregnancy).  [x]    Stressed the importance of regular exercise.   [x]   Substance Abuse: Discussed cessation/primary prevention of tobacco, alcohol, or other drug use; driving or other dangerous activities under the influence; availability of treatment for abuse.   [x]    Injury prevention: Discussed safety belts, safety helmets, smoke detector, smoking near bedding or upholstery.   [x]    Sexuality: Discussed sexually transmitted diseases, partner selection, use of condoms, avoidance of unintended pregnancy  and contraceptive alternatives.  [x]    Dental health: Discussed importance of regular tooth brushing, flossing, and dental visits.  [x]    Health maintenance and immunizations reviewed. Please refer to Health maintenance section.   CMA or LPN served as scribe during this visit. History, Physical, and Plan performed by medical provider. The above documentation has been reviewed and is accurate and complete.   Inda Coke, PA-C [place]

## 2018-08-23 NOTE — Patient Instructions (Addendum)
It was great to see you!  Please go to the lab for blood work.   Our office will call you with your results unless you have chosen to receive results via MyChart.  If your blood work is normal we will follow-up each year for physicals and as scheduled for chronic medical problems.  If anything is abnormal we will treat accordingly and get you in for a follow-up.  Take care,  [facility] Maintenance, Female Adopting a healthy lifestyle and getting preventive care can go a long way to promote health and wellness. Talk with your health care provider about what schedule of regular examinations is right for you. This is a good chance for you to check in with your provider about disease prevention and staying healthy. In between checkups, there are plenty of things you can do on your own. Experts have done a lot of research about which lifestyle changes and preventive measures are most likely to keep you healthy. Ask your health care provider for more information. Weight and diet Eat a healthy diet  Be sure to include plenty of vegetables, fruits, low-fat dairy products, and lean protein.  Do not eat a lot of foods high in solid fats, added sugars, or salt.  Get regular exercise. This is one of the most important things you can do for your health. ? Most adults should exercise for at least 150 minutes each week. The exercise should increase your heart rate and make you sweat (moderate-intensity exercise). ? Most adults should also do strengthening exercises at least twice a week. This is in addition to the moderate-intensity exercise. Maintain a healthy weight  Body mass index (BMI) is a measurement that can be used to identify possible weight problems. It estimates body fat based on height and weight. Your health care provider can help determine your BMI and help you achieve or maintain a healthy weight.  For females 51 years of age and older: ? A BMI below 18.5 is considered  underweight. ? A BMI of 18.5 to 24.9 is normal. ? A BMI of 25 to 29.9 is considered overweight. ? A BMI of 30 and above is considered obese. Watch levels of cholesterol and blood lipids  You should start having your blood tested for lipids and cholesterol at 62 years of age, then have this test every 5 years.  You may need to have your cholesterol levels checked more often if: ? Your lipid or cholesterol levels are high. ? You are older than 62 years of age. ? You are at high risk for heart disease. Cancer screening Lung Cancer  Lung cancer screening is recommended for adults 41-59 years old who are at high risk for lung cancer because of a history of smoking.  A yearly low-dose CT scan of the lungs is recommended for people who: ? Currently smoke. ? Have quit within the past 15 years. ? Have at least a 30-pack-year history of smoking. A pack year is smoking an average of one pack of cigarettes a day for 1 year.  Yearly screening should continue until it has been 15 years since you quit.  Yearly screening should stop if you develop a health problem that would prevent you from having lung cancer treatment. Breast Cancer  Practice breast self-awareness. This means understanding how your breasts normally appear and feel.  It also means doing regular breast self-exams. Let your health care provider know about any changes, no matter how small.  If you are in your  your 20s or 30s, you should have a clinical breast exam (CBE) by a health care provider every 1-3 years as part of a regular health exam.  If you are 40 or older, have a CBE every year. Also consider having a breast X-ray (mammogram) every year.  If you have a family history of breast cancer, talk to your health care provider about genetic screening.  If you are at high risk for breast cancer, talk to your health care provider about having an MRI and a mammogram every year.  Breast cancer gene (BRCA) assessment is recommended  for women who have family members with BRCA-related cancers. BRCA-related cancers include: ? Breast. ? Ovarian. ? Tubal. ? Peritoneal cancers.  Results of the assessment will determine the need for genetic counseling and BRCA1 and BRCA2 testing. Cervical Cancer Your health care provider may recommend that you be screened regularly for cancer of the pelvic organs (ovaries, uterus, and vagina). This screening involves a pelvic examination, including checking for microscopic changes to the surface of your cervix (Pap test). You may be encouraged to have this screening done every 3 years, beginning at age 21.  For women ages 30-65, health care providers may recommend pelvic exams and Pap testing every 3 years, or they may recommend the Pap and pelvic exam, combined with testing for human papilloma virus (HPV), every 5 years. Some types of HPV increase your risk of cervical cancer. Testing for HPV may also be done on women of any age with unclear Pap test results.  Other health care providers may not recommend any screening for nonpregnant women who are considered low risk for pelvic cancer and who do not have symptoms. Ask your health care provider if a screening pelvic exam is right for you.  If you have had past treatment for cervical cancer or a condition that could lead to cancer, you need Pap tests and screening for cancer for at least 20 years after your treatment. If Pap tests have been discontinued, your risk factors (such as having a new sexual partner) need to be reassessed to determine if screening should resume. Some women have medical problems that increase the chance of getting cervical cancer. In these cases, your health care provider may recommend more frequent screening and Pap tests. Colorectal Cancer  This type of cancer can be detected and often prevented.  Routine colorectal cancer screening usually begins at 62 years of age and continues through 62 years of age.  Your health  care provider may recommend screening at an earlier age if you have risk factors for colon cancer.  Your health care provider may also recommend using home test kits to check for hidden blood in the stool.  A small camera at the end of a tube can be used to examine your colon directly (sigmoidoscopy or colonoscopy). This is done to check for the earliest forms of colorectal cancer.  Routine screening usually begins at age 50.  Direct examination of the colon should be repeated every 5-10 years through 62 years of age. However, you may need to be screened more often if early forms of precancerous polyps or small growths are found. Skin Cancer  Check your skin from head to toe regularly.  Tell your health care provider about any new moles or changes in moles, especially if there is a change in a mole's shape or color.  Also tell your health care provider if you have a mole that is larger than the size of a pencil   Always use sunscreen. Apply sunscreen liberally and repeatedly throughout the day.  Protect yourself by wearing long sleeves, pants, a wide-brimmed hat, and sunglasses whenever you are outside. Heart disease, diabetes, and high blood pressure  High blood pressure causes heart disease and increases the risk of stroke. High blood pressure is more likely to develop in: ? People who have blood pressure in the high end of the normal range (130-139/85-89 mm Hg). ? People who are overweight or obese. ? People who are African American.  If you are 48-33 years of age, have your blood pressure checked every 3-5 years. If you are 1 years of age or older, have your blood pressure checked every year. You should have your blood pressure measured twice-once when you are at a hospital or clinic, and once when you are not at a hospital or clinic. Record the average of the two measurements. To check your blood pressure when you are not at a hospital or clinic, you can use: ? An automated  blood pressure machine at a pharmacy. ? A home blood pressure monitor.  If you are between 3 years and 75 years old, ask your health care provider if you should take aspirin to prevent strokes.  Have regular diabetes screenings. This involves taking a blood sample to check your fasting blood sugar level. ? If you are at a normal weight and have a low risk for diabetes, have this test once every three years after 62 years of age. ? If you are overweight and have a high risk for diabetes, consider being tested at a younger age or more often. Preventing infection Hepatitis B  If you have a higher risk for hepatitis B, you should be screened for this virus. You are considered at high risk for hepatitis B if: ? You were born in a country where hepatitis B is common. Ask your health care provider which countries are considered high risk. ? Your parents were born in a high-risk country, and you have not been immunized against hepatitis B (hepatitis B vaccine). ? You have HIV or AIDS. ? You use needles to inject street drugs. ? You live with someone who has hepatitis B. ? You have had sex with someone who has hepatitis B. ? You get hemodialysis treatment. ? You take certain medicines for conditions, including cancer, organ transplantation, and autoimmune conditions. Hepatitis C  Blood testing is recommended for: ? Everyone born from [birth year] through 1964. ? Anyone with known risk factors for hepatitis C. Sexually transmitted infections (STIs)  You should be screened for sexually transmitted infections (STIs) including gonorrhea and chlamydia if: ? You are sexually active and are younger than 62 years of age. ? You are older than 62 years of age and your health care provider tells you that you are at risk for this type of infection. ? Your sexual activity has changed since you were last screened and you are at an increased risk for chlamydia or gonorrhea. Ask your health care provider if you are at  risk.  If you do not have HIV, but are at risk, it may be recommended that you take a prescription medicine daily to prevent HIV infection. This is called pre-exposure prophylaxis (PrEP). You are considered at risk if: ? You are sexually active and do not regularly use condoms or know the HIV status of your partner(s). ? You take drugs by injection. ? You are sexually active with a partner who has HIV. Talk with your health care provider  about whether you are at high risk of being infected with HIV. If you choose to begin PrEP, you should first be tested for HIV. You should then be tested every 3 months for as long as you are taking PrEP. Pregnancy  If you are premenopausal and you may become pregnant, ask your health care provider about preconception counseling.  If you may become pregnant, take 400 to 800 micrograms (mcg) of folic acid every day.  If you want to prevent pregnancy, talk to your health care provider about birth control (contraception). Osteoporosis and menopause  Osteoporosis is a disease in which the bones lose minerals and strength with aging. This can result in serious bone fractures. Your risk for osteoporosis can be identified using a bone density scan.  If you are 68 years of age or older, or if you are at risk for osteoporosis and fractures, ask your health care provider if you should be screened.  Ask your health care provider whether you should take a calcium or vitamin D supplement to lower your risk for osteoporosis.  Menopause may have certain physical symptoms and risks.  Hormone replacement therapy may reduce some of these symptoms and risks. Talk to your health care provider about whether hormone replacement therapy is right for you. Follow these instructions at home:  Schedule regular health, dental, and eye exams.  Stay current with your immunizations.  Do not use any tobacco products including cigarettes, chewing tobacco, or electronic  cigarettes.  If you are pregnant, do not drink alcohol.  If you are breastfeeding, limit how much and how often you drink alcohol.  Limit alcohol intake to no more than 1 drink per day for nonpregnant women. One drink equals 12 ounces of beer, 5 ounces of wine, or 1 ounces of hard liquor.  Do not use street drugs.  Do not share needles.  Ask your health care provider for help if you need support or information about quitting drugs.  Tell your health care provider if you often feel depressed.  Tell your health care provider if you have ever been abused or do not feel safe at home. This information is not intended to replace advice given to you by your health care provider. Make sure you discuss any questions you have with your health care provider. Document Released: 09/09/2010 Document Revised: 08/02/2015 Document Reviewed: 11/28/2014 Elsevier Interactive Patient Education  2019 [employer].

## 2018-12-10 DIAGNOSIS — Z20828 Contact with and (suspected) exposure to other viral communicable diseases: Secondary | ICD-10-CM | POA: Diagnosis not present

## 2019-01-10 ENCOUNTER — Other Ambulatory Visit: Payer: Self-pay | Admitting: Physician Assistant

## 2019-01-10 DIAGNOSIS — Z1231 Encounter for screening mammogram for malignant neoplasm of breast: Secondary | ICD-10-CM

## 2019-01-27 ENCOUNTER — Ambulatory Visit (INDEPENDENT_AMBULATORY_CARE_PROVIDER_SITE_OTHER): Payer: BC Managed Care – PPO

## 2019-01-27 ENCOUNTER — Other Ambulatory Visit: Payer: Self-pay

## 2019-01-27 DIAGNOSIS — Z1231 Encounter for screening mammogram for malignant neoplasm of breast: Secondary | ICD-10-CM

## 2019-02-15 DIAGNOSIS — H353131 Nonexudative age-related macular degeneration, bilateral, early dry stage: Secondary | ICD-10-CM | POA: Diagnosis not present

## 2019-08-24 ENCOUNTER — Ambulatory Visit (INDEPENDENT_AMBULATORY_CARE_PROVIDER_SITE_OTHER): Payer: BC Managed Care – PPO | Admitting: Physician Assistant

## 2019-08-24 ENCOUNTER — Other Ambulatory Visit: Payer: Self-pay

## 2019-08-24 ENCOUNTER — Encounter: Payer: Self-pay | Admitting: Physician Assistant

## 2019-08-24 VITALS — BP 138/90 | HR 70 | Temp 98.2°F | Ht 60.0 in | Wt 129.0 lb

## 2019-08-24 DIAGNOSIS — E559 Vitamin D deficiency, unspecified: Secondary | ICD-10-CM

## 2019-08-24 DIAGNOSIS — R5383 Other fatigue: Secondary | ICD-10-CM

## 2019-08-24 DIAGNOSIS — J3089 Other allergic rhinitis: Secondary | ICD-10-CM

## 2019-08-24 DIAGNOSIS — Z136 Encounter for screening for cardiovascular disorders: Secondary | ICD-10-CM

## 2019-08-24 DIAGNOSIS — Z0000 Encounter for general adult medical examination without abnormal findings: Secondary | ICD-10-CM

## 2019-08-24 DIAGNOSIS — Z13220 Encounter for screening for lipoid disorders: Secondary | ICD-10-CM

## 2019-08-24 DIAGNOSIS — R82998 Other abnormal findings in urine: Secondary | ICD-10-CM | POA: Diagnosis not present

## 2019-08-24 DIAGNOSIS — F320 Major depressive disorder, single episode, mild: Secondary | ICD-10-CM | POA: Diagnosis not present

## 2019-08-24 DIAGNOSIS — Z0001 Encounter for general adult medical examination with abnormal findings: Secondary | ICD-10-CM

## 2019-08-24 LAB — URINALYSIS, ROUTINE W REFLEX MICROSCOPIC
Bilirubin Urine: NEGATIVE
Hgb urine dipstick: NEGATIVE
Ketones, ur: NEGATIVE
Nitrite: NEGATIVE
RBC / HPF: NONE SEEN (ref 0–?)
Specific Gravity, Urine: 1.015 (ref 1.000–1.030)
Total Protein, Urine: NEGATIVE
Urine Glucose: NEGATIVE
Urobilinogen, UA: 0.2 (ref 0.0–1.0)
pH: 7 (ref 5.0–8.0)

## 2019-08-24 LAB — CBC WITH DIFFERENTIAL/PLATELET
Basophils Absolute: 0 10*3/uL (ref 0.0–0.1)
Basophils Relative: 0.5 % (ref 0.0–3.0)
Eosinophils Absolute: 0.1 10*3/uL (ref 0.0–0.7)
Eosinophils Relative: 1.2 % (ref 0.0–5.0)
HCT: 41.7 % (ref 36.0–46.0)
Hemoglobin: 13.8 g/dL (ref 12.0–15.0)
Lymphocytes Relative: 20.2 % (ref 12.0–46.0)
Lymphs Abs: 1.6 10*3/uL (ref 0.7–4.0)
MCHC: 33.2 g/dL (ref 30.0–36.0)
MCV: 89.4 fl (ref 78.0–100.0)
Monocytes Absolute: 0.7 10*3/uL (ref 0.1–1.0)
Monocytes Relative: 8.1 % (ref 3.0–12.0)
Neutro Abs: 5.7 10*3/uL (ref 1.4–7.7)
Neutrophils Relative %: 70 % (ref 43.0–77.0)
Platelets: 244 10*3/uL (ref 150.0–400.0)
RBC: 4.66 Mil/uL (ref 3.87–5.11)
RDW: 13.7 % (ref 11.5–15.5)
WBC: 8.1 10*3/uL (ref 4.0–10.5)

## 2019-08-24 LAB — COMPREHENSIVE METABOLIC PANEL
ALT: 11 U/L (ref 0–35)
AST: 21 U/L (ref 0–37)
Albumin: 4.4 g/dL (ref 3.5–5.2)
Alkaline Phosphatase: 70 U/L (ref 39–117)
BUN: 13 mg/dL (ref 6–23)
CO2: 29 mEq/L (ref 19–32)
Calcium: 9.4 mg/dL (ref 8.4–10.5)
Chloride: 105 mEq/L (ref 96–112)
Creatinine, Ser: 0.79 mg/dL (ref 0.40–1.20)
GFR: 73.61 mL/min (ref >60.00)
Glucose, Bld: 81 mg/dL (ref 70–99)
Potassium: 3.9 mEq/L (ref 3.5–5.1)
Sodium: 142 mEq/L (ref 135–145)
Total Bilirubin: 0.6 mg/dL (ref 0.2–1.2)
Total Protein: 6.8 g/dL (ref 6.0–8.3)

## 2019-08-24 LAB — VITAMIN D 25 HYDROXY (VIT D DEFICIENCY, FRACTURES): VITD: 36.78 ng/mL (ref 30.00–100.00)

## 2019-08-24 LAB — LIPID PANEL
Cholesterol: 246 mg/dL — ABNORMAL HIGH (ref 0–200)
HDL: 82 mg/dL (ref >39.00)
LDL Cholesterol: 145 mg/dL — ABNORMAL HIGH (ref 0–99)
NonHDL: 163.66
Total CHOL/HDL Ratio: 3
Triglycerides: 91 mg/dL (ref 0.0–149.0)
VLDL: 18.2 mg/dL (ref 0.0–40.0)

## 2019-08-24 LAB — VITAMIN B12: Vitamin B-12: 388 pg/mL (ref 211–911)

## 2019-08-24 MED ORDER — IPRATROPIUM BROMIDE 0.03 % NA SOLN: 2.0000 | Freq: Two times a day (BID) | NASAL | 3 refills | Status: AC

## 2019-08-24 NOTE — Patient Instructions (Addendum)
It was great to see you!  1. Please call for a therapy appointment 2. Consider changing Claritin to Xyzal 3. Change flonase to atrovent nasal spray 4. Consider Calcium Scoring (Cardiac CT) -- information at end of this packet 5. Please schedule your mammo  Please go to the lab for blood work.   Our office will call you with your results unless you have chosen to receive results via MyChart.  If your blood work is normal we will follow-up each year for physicals and as scheduled for chronic medical problems.  If anything is abnormal we will treat accordingly and get you in for a follow-up.  Take care,  [facility] Maintenance, Female Adopting a healthy lifestyle and getting preventive care are important in promoting health and wellness. Ask your health care provider about:  The right schedule for you to have regular tests and exams.  Things you can do on your own to prevent diseases and keep yourself healthy. What should I know about diet, weight, and exercise? Eat a healthy diet   Eat a diet that includes plenty of vegetables, fruits, low-fat dairy products, and lean protein.  Do not eat a lot of foods that are high in solid fats, added sugars, or sodium. Maintain a healthy weight Body mass index (BMI) is used to identify weight problems. It estimates body fat based on height and weight. Your health care provider can help determine your BMI and help you achieve or maintain a healthy weight. Get regular exercise Get regular exercise. This is one of the most important things you can do for your health. Most adults should:  Exercise for at least 150 minutes each week. The exercise should increase your heart rate and make you sweat (moderate-intensity exercise).  Do strengthening exercises at least twice a week. This is in addition to the moderate-intensity exercise.  Spend less time sitting. Even light physical activity can be beneficial. Watch cholesterol and blood  lipids Have your blood tested for lipids and cholesterol at 63 years of age, then have this test every 5 years. Have your cholesterol levels checked more often if:  Your lipid or cholesterol levels are high.  You are older than 63 years of age.  You are at high risk for heart disease. What should I know about cancer screening? Depending on your health history and family history, you may need to have cancer screening at various ages. This may include screening for:  Breast cancer.  Cervical cancer.  Colorectal cancer.  Skin cancer.  Lung cancer. What should I know about heart disease, diabetes, and high blood pressure? Blood pressure and heart disease  High blood pressure causes heart disease and increases the risk of stroke. This is more likely to develop in people who have high blood pressure readings, are of African descent, or are overweight.  Have your blood pressure checked: ? Every 3-5 years if you are 35-88 years of age. ? Every year if you are 33 years old or older. Diabetes Have regular diabetes screenings. This checks your fasting blood sugar level. Have the screening done:  Once every three years after age 76 if you are at a normal weight and have a low risk for diabetes.  More often and at a younger age if you are overweight or have a high risk for diabetes. What should I know about preventing infection? Hepatitis B If you have a higher risk for hepatitis B, you should be screened for this virus. Talk with  your health care provider to find out if you are at risk for hepatitis B infection. Hepatitis C Testing is recommended for:  Everyone born from [birth year] through 1964.  Anyone with known risk factors for hepatitis C. Sexually transmitted infections (STIs)  Get screened for STIs, including gonorrhea and chlamydia, if: ? You are sexually active and are younger than 63 years of age. ? You are older than 63 years of age and your health care provider tells you that  you are at risk for this type of infection. ? Your sexual activity has changed since you were last screened, and you are at increased risk for chlamydia or gonorrhea. Ask your health care provider if you are at risk.  Ask your health care provider about whether you are at high risk for HIV. Your health care provider may recommend a prescription medicine to help prevent HIV infection. If you choose to take medicine to prevent HIV, you should first get tested for HIV. You should then be tested every 3 months for as long as you are taking the medicine. Pregnancy  If you are about to stop having your period (premenopausal) and you may become pregnant, seek counseling before you get pregnant.  Take 400 to 800 micrograms (mcg) of folic acid every day if you become pregnant.  Ask for birth control (contraception) if you want to prevent pregnancy. Osteoporosis and menopause Osteoporosis is a disease in which the bones lose minerals and strength with aging. This can result in bone fractures. If you are 33 years old or older, or if you are at risk for osteoporosis and fractures, ask your health care provider if you should:  Be screened for bone loss.  Take a calcium or vitamin D supplement to lower your risk of fractures.  Be given hormone replacement therapy (HRT) to treat symptoms of menopause. Follow these instructions at home: Lifestyle  Do not use any products that contain nicotine or tobacco, such as cigarettes, e-cigarettes, and chewing tobacco. If you need help quitting, ask your health care provider.  Do not use street drugs.  Do not share needles.  Ask your health care provider for help if you need support or information about quitting drugs. Alcohol use  Do not drink alcohol if: ? Your health care provider tells you not to drink. ? You are pregnant, may be pregnant, or are planning to become pregnant.  If you drink alcohol: ? Limit how much you use to 0-1 drink a day. ? Limit  intake if you are breastfeeding.  Be aware of how much alcohol is in your drink. In the [place] one drink equals one 12 oz bottle of beer (355 mL), one 5 oz glass of wine (148 mL), or one 1 oz glass of hard liquor (44 mL). General instructions  Schedule regular health, dental, and eye exams.  Stay current with your vaccines.  Tell your health care provider if: ? You often feel depressed. ? You have ever been abused or do not feel safe at home. Summary  Adopting a healthy lifestyle and getting preventive care are important in promoting health and wellness.  Follow your health care provider's instructions about healthy diet, exercising, and getting tested or screened for diseases.  Follow your health care provider's instructions on monitoring your cholesterol and blood pressure. This information is not intended to replace advice given to you by your health care provider. Make sure you discuss any questions you have with your health care provider. Document Revised: 02/17/2018 Document  Reviewed: 02/17/2018 Elsevier Patient Education  [place].   Coronary Calcium Scan A coronary calcium scan is an imaging test used to look for deposits of plaque in the inner lining of the blood vessels of the heart (coronary arteries). Plaque is made up of calcium, protein, and fatty substances. These deposits of plaque can partly clog and narrow the coronary arteries without producing any symptoms or warning signs. This puts a person at risk for a heart attack. This test is recommended for people who are at moderate risk for heart disease. The test can find plaque deposits before symptoms develop. Tell a health care provider about:  Any allergies you have.  All medicines you are taking, including vitamins, herbs, eye drops, creams, and over-the-counter medicines.  Any problems you or family members have had with anesthetic medicines.  Any blood disorders you have.  Any surgeries you have  had.  Any medical conditions you have.  Whether you are pregnant or may be pregnant. What are the risks? Generally, this is a safe procedure. However, problems may occur, including:  Harm to a pregnant woman and her unborn baby. This test involves the use of radiation. Radiation exposure can be dangerous to a pregnant woman and her unborn baby. If you are pregnant or think you may be pregnant, you should not have this procedure done.  Slight increase in the risk of cancer. This is because of the radiation involved in the test. What happens before the procedure? Ask your health care provider for any specific instructions on how to prepare for this procedure. You may be asked to avoid products that contain caffeine, tobacco, or nicotine for 4 hours before the procedure. What happens during the procedure?   You will undress and remove any jewelry from your neck or chest.  You will put on a hospital gown.  Sticky electrodes will be placed on your chest. The electrodes will be connected to an electrocardiogram (ECG) machine to record a tracing of the electrical activity of your heart.  You will lie down on a curved bed that is attached to the [place].  You may be given medicine to slow down your heart rate so that clear pictures can be created.  You will be moved into the CT scanner, and the CT scanner will take pictures of your heart. During this time, you will be asked to lie still and hold your breath for 2-3 seconds at a time while each picture of your heart is being taken. The procedure may vary among health care providers and hospitals. What happens after the procedure?  You can get dressed.  You can return to your normal activities.  It is up to you to get the results of your procedure. Ask your health care provider, or the department that is doing the procedure, when your results will be ready. Summary  A coronary calcium scan is an imaging test used to look for deposits of  plaque in the inner lining of the blood vessels of the heart (coronary arteries). Plaque is made up of calcium, protein, and fatty substances.  Generally, this is a safe procedure. Tell your health care provider if you are pregnant or may be pregnant.  Ask your health care provider for any specific instructions on how to prepare for this procedure.  A CT scanner will take pictures of your heart.  You can return to your normal activities after the scan is done. This information is not intended to replace advice given  to you by your health care provider. Make sure you discuss any questions you have with your health care provider. Document Revised: 09/14/2018 Document Reviewed: 09/14/2018 Elsevier Patient Education  [place].

## 2019-08-24 NOTE — Progress Notes (Signed)
I acted as a Education administrator for [employer], PA-C Anselmo Pickler, LPN    Subjective:    Robin Stevens is a 64 y.o. female and is here for a comprehensive physical exam.   HPI  Health Maintenance Due  Topic Date Due  . DEXA SCAN  Never done    Acute Concerns: Fatigue/Mild depression -- feels similar to how she did in her 20's when she would occasionally get a UTI. Denies any dysuria, hematuria, n/v. Also has had decrease in her mood but feels like it is related to "toxic work environment." Denies SI/HI. She feels lack of motivation to do things. She has been contemplating seeing a therapist.  Chronic Issues: Allergies -- taking Claritin and Flonase regularly. Still getting breakthrough symptoms. Went to the zoo and had a reaction to something while there and had to deal with increased congestion. Vit D deficiency -- takes regular supplementation; does strength training  Health Maintenance: Immunizations -- UTD; fully COVID vaccinated Colonoscopy -- due 07/2024 Mammogram -- UTD, due 01/2020 PAP -- due 08/2020 Bone Density -- never Diet -- overall well balanced Caffeine intake -- two cups daily Sleep habits -- takes occasional melatonin Exercise -- yoga; strength training; walking/kayaking Weight -- Weight: 129 lb (58.5 kg)  Mood -- mild depression; no anxiety Weight history: Wt Readings from Last 10 Encounters:  08/24/19 129 lb (58.5 kg)  08/23/18 131 lb 4 oz (59.5 kg)  08/17/17 128 lb 4 oz (58.2 kg)  07/16/17 131 lb (59.4 kg)   Body mass index is 25.19 kg/m. No LMP recorded. Patient is postmenopausal. Alcohol use: rare Tobacco use: none  Depression screen PHQ 2/9 08/24/2019  Decreased Interest 1  Down, Depressed, Hopeless 2  PHQ - 2 Score 3  Altered sleeping 1  Tired, decreased energy 2  Change in appetite 1  Feeling bad or failure about yourself  2  Trouble concentrating 1  Moving slowly or fidgety/restless 0  Suicidal thoughts 0  PHQ-9 Score 10  Difficult  doing work/chores Somewhat difficult     Other providers/specialists: Patient Care Team: Inda Coke, [place] as PCP - General (Physician Assistant)    PMHx, SurgHx, SocialHx, Medications, and Allergies were reviewed in the Visit Navigator and updated as appropriate.   Past Medical History:  Diagnosis Date  . GERD (gastroesophageal reflux disease)   . History of chicken pox   . Macular degeneration of both eyes 2016     Past Surgical History:  Procedure Laterality Date  . HERNIA REPAIR Bilateral 1999   Inguinal   . removal of ovarian cyst Right 1995  . 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 VAGINAL DELIVERY  1985, 1987, 1989     Family History  Problem Relation Age of Onset  . Arthritis Mother   . Heart attack Mother   . Heart disease Mother   . Pancreatic cancer Father   . Early death Maternal Grandmother 40       cardiac-related  . Heart disease Maternal Grandfather     Social History   Tobacco Use  . Smoking status: Never Smoker  . Smokeless tobacco: Never Used  Vaping Use  . Vaping Use: Never used  Substance Use Topics  . Alcohol use: Yes    Comment: socially  . Drug use: Never    Review of Systems:   Review of Systems  Constitutional: Positive for malaise/fatigue. Negative for chills, fever and weight loss.  HENT: Negative for hearing loss, sinus pain and sore throat.  Eyes: Negative for blurred vision.  Respiratory: Negative for cough and shortness of breath.   Cardiovascular: Negative for chest pain, palpitations and leg swelling.  Gastrointestinal: Negative for abdominal pain, constipation, diarrhea, heartburn, nausea and vomiting.  Genitourinary: Negative for dysuria, frequency and urgency.  Musculoskeletal: Negative for back pain, myalgias and neck pain.  Skin: Negative for itching and rash.  Neurological: Negative for dizziness, tingling, seizures, loss of consciousness and headaches.  Endo/Heme/Allergies: Negative for polydipsia.   Psychiatric/Behavioral: Negative for depression. The patient is not nervous/anxious.   All other systems reviewed and are negative.   Objective:   BP 138/90 (BP Location: Left Arm, Patient Position: Sitting, Cuff Size: Normal)   Pulse 70   Temp 98.2 F (36.8 C) (Temporal)   Ht 5' (1.524 m)   Wt 129 lb (58.5 kg)   SpO2 99%   BMI 25.19 kg/m  Body mass index is 25.19 kg/m.   General Appearance:    Alert, cooperative, no distress, appears stated age  Head:    Normocephalic, without obvious abnormality, atraumatic  Eyes:    PERRL, conjunctiva/corneas clear, EOM's intact, fundi    benign, both eyes  Ears:    Normal TM's and external ear canals, both ears  Nose:   Nares normal, septum midline, mucosa normal, no drainage    or sinus tenderness  Throat:   Lips, mucosa, and tongue normal; teeth and gums normal  Neck:   Supple, symmetrical, trachea midline, no adenopathy;    thyroid:  no enlargement/tenderness/nodules; no carotid   bruit or JVD  Back:     Symmetric, no curvature, ROM normal, no CVA tenderness  Lungs:     Clear to auscultation bilaterally, respirations unlabored  Chest Wall:    No tenderness or deformity   Heart:    Regular rate and rhythm, S1 and S2 normal, no murmur, rub or gallop  Breast Exam:    No tenderness, masses, or nipple abnormality  Abdomen:     Soft, non-tender, bowel sounds active all four quadrants,    no masses, no organomegaly  Genitalia:    Deferred  Extremities:   Extremities normal, atraumatic, no cyanosis or edema  Pulses:   2+ and symmetric all extremities  Skin:   Skin color, texture, turgor normal, no rashes or lesions  Lymph nodes:   Cervical, supraclavicular, and axillary nodes normal  Neurologic:   CNII-XII intact, normal strength, sensation and reflexes    throughout     Assessment/Plan:   Robin Stevens was seen today for annual exam.  Diagnoses and all orders for this visit:  Encounter for general adult medical examination with abnormal  findings Today patient counseled on age appropriate routine health concerns for screening and prevention, each reviewed and up to date or declined. Immunizations reviewed and up to date or declined. Labs ordered and reviewed. Risk factors for depression reviewed and negative. Hearing function and visual acuity are intact. ADLs screened and addressed as needed. Functional ability and level of safety reviewed and appropriate. Education, counseling and referrals performed based on assessed risks today. Patient provided with a copy of personalized plan for preventive services.  Vitamin D deficiency Will update lab and provide recommendations as appropriate. -     VITAMIN D 25 Hydroxy (Vit-D Deficiency, Fractures)  Environmental and seasonal allergies Recommend that she change claritin to another antihistamine (such as xyzal) and switch out flonase nasal spray with atrovent nasal spray.  Fatigue, unspecified type Will update labs and check UA per patient request. Encouraged reaching  out to counselor to see if her mood is playing a role. Further intervention based on results and clinical symptoms. -     CBC with Differential/Platelet -     Comprehensive metabolic panel -     Vitamin B12 -     Urinalysis, Routine w reflex microscopic -     Urine Culture  Encounter for lipid screening for cardiovascular disease -     Lipid panel  Depression, major, single episode, mild ([place]) Encouraged reaching out to counselor to see if her mood is playing a role. Denies SI/HI. Declines further intervention.  Other orders -     ipratropium (ATROVENT) 0.03 % nasal spray; Place 2 sprays into both nostrils every 12 (twelve) hours.     Well Adult Exam: Labs ordered: Yes. Patient counseling was done. See below for items discussed. Discussed the patient's BMI.  The BMI is in the acceptable range Follow up as needed for acute illness. Breast cancer screening: UTD. Cervical cancer screening: UTD   Patient  Counseling: [x]    Nutrition: Stressed importance of moderation in sodium/caffeine intake, saturated fat and cholesterol, caloric balance, sufficient intake of fresh fruits, vegetables, fiber, calcium, iron, and 1 mg of folate supplement per day (for females capable of pregnancy).  [x]    Stressed the importance of regular exercise.   [x]    Substance Abuse: Discussed cessation/primary prevention of tobacco, alcohol, or other drug use; driving or other dangerous activities under the influence; availability of treatment for abuse.   [x]    Injury prevention: Discussed safety belts, safety helmets, smoke detector, smoking near bedding or upholstery.   [x]    Sexuality: Discussed sexually transmitted diseases, partner selection, use of condoms, avoidance of unintended pregnancy  and contraceptive alternatives.  [x]    Dental health: Discussed importance of regular tooth brushing, flossing, and dental visits.  [x]    Health maintenance and immunizations reviewed. Please refer to Health maintenance section.   CMA or LPN served as scribe during this visit. History, Physical, and Plan performed by medical provider. The above documentation has been reviewed and is accurate and complete.   Inda Coke, PA-C [place]

## 2019-08-25 ENCOUNTER — Other Ambulatory Visit: Payer: Self-pay | Admitting: Physician Assistant

## 2019-08-25 ENCOUNTER — Encounter: Payer: Self-pay | Admitting: Physician Assistant

## 2019-08-25 LAB — URINE CULTURE
MICRO NUMBER:: 10598450
SPECIMEN QUALITY:: ADEQUATE

## 2019-08-25 MED ORDER — PRAVASTATIN SODIUM 20 MG PO TABS: 20.0000 mg | ORAL_TABLET | Freq: Every day | ORAL | 1 refills | Status: DC

## 2019-09-02 ENCOUNTER — Other Ambulatory Visit: Payer: Self-pay | Admitting: Physician Assistant

## 2019-09-14 DIAGNOSIS — H353132 Nonexudative age-related macular degeneration, bilateral, intermediate dry stage: Secondary | ICD-10-CM | POA: Diagnosis not present

## 2019-12-14 ENCOUNTER — Other Ambulatory Visit: Payer: Self-pay | Admitting: Physician Assistant

## 2019-12-14 ENCOUNTER — Encounter: Payer: Self-pay | Admitting: Physician Assistant

## 2019-12-14 DIAGNOSIS — E785 Hyperlipidemia, unspecified: Secondary | ICD-10-CM

## 2019-12-21 ENCOUNTER — Other Ambulatory Visit: Payer: Self-pay

## 2019-12-21 ENCOUNTER — Other Ambulatory Visit: Payer: BC Managed Care – PPO

## 2019-12-21 DIAGNOSIS — E785 Hyperlipidemia, unspecified: Secondary | ICD-10-CM

## 2019-12-22 LAB — LIPID PANEL
Cholesterol: 209 mg/dL — ABNORMAL HIGH (ref <200)
HDL: 94 mg/dL (ref >=50)
LDL Cholesterol (Calc): 101 mg/dL (calc) — ABNORMAL HIGH
Non-HDL Cholesterol (Calc): 115 mg/dL (calc) (ref <130)
Total CHOL/HDL Ratio: 2.2 (calc) (ref <5.0)
Triglycerides: 59 mg/dL (ref <150)

## 2020-01-23 ENCOUNTER — Other Ambulatory Visit: Payer: Self-pay | Admitting: Physician Assistant

## 2020-01-23 DIAGNOSIS — Z1231 Encounter for screening mammogram for malignant neoplasm of breast: Secondary | ICD-10-CM

## 2020-02-10 ENCOUNTER — Other Ambulatory Visit: Payer: Self-pay | Admitting: Physician Assistant

## 2020-02-13 DIAGNOSIS — H353131 Nonexudative age-related macular degeneration, bilateral, early dry stage: Secondary | ICD-10-CM | POA: Diagnosis not present

## 2020-03-29 ENCOUNTER — Ambulatory Visit: Payer: BC Managed Care – PPO

## 2020-03-29 ENCOUNTER — Other Ambulatory Visit: Payer: Self-pay

## 2020-04-13 DIAGNOSIS — H353132 Nonexudative age-related macular degeneration, bilateral, intermediate dry stage: Secondary | ICD-10-CM | POA: Diagnosis not present

## 2020-04-30 DIAGNOSIS — H2511 Age-related nuclear cataract, right eye: Secondary | ICD-10-CM | POA: Diagnosis not present

## 2020-05-07 DIAGNOSIS — S6991XA Unspecified injury of right wrist, hand and finger(s), initial encounter: Secondary | ICD-10-CM | POA: Diagnosis not present

## 2020-05-07 DIAGNOSIS — M7989 Other specified soft tissue disorders: Secondary | ICD-10-CM | POA: Diagnosis not present

## 2020-05-07 DIAGNOSIS — R040 Epistaxis: Secondary | ICD-10-CM | POA: Diagnosis not present

## 2020-05-09 ENCOUNTER — Other Ambulatory Visit: Payer: Self-pay

## 2020-05-09 ENCOUNTER — Ambulatory Visit (INDEPENDENT_AMBULATORY_CARE_PROVIDER_SITE_OTHER): Payer: BC Managed Care – PPO

## 2020-05-09 DIAGNOSIS — Z1231 Encounter for screening mammogram for malignant neoplasm of breast: Secondary | ICD-10-CM | POA: Diagnosis not present

## 2020-05-16 DIAGNOSIS — H2511 Age-related nuclear cataract, right eye: Secondary | ICD-10-CM | POA: Diagnosis not present

## 2020-05-30 DIAGNOSIS — H2512 Age-related nuclear cataract, left eye: Secondary | ICD-10-CM | POA: Diagnosis not present

## 2020-08-06 ENCOUNTER — Other Ambulatory Visit: Payer: Self-pay | Admitting: Physician Assistant

## 2020-09-03 ENCOUNTER — Ambulatory Visit (INDEPENDENT_AMBULATORY_CARE_PROVIDER_SITE_OTHER)
Admission: RE | Admit: 2020-09-03 | Discharge: 2020-09-03 | Disposition: A | Discharge status: Home / Self Care | Payer: BC Managed Care – PPO | Source: Ambulatory Visit | Attending: Family Medicine | Admitting: Family Medicine

## 2020-09-03 ENCOUNTER — Encounter: Payer: Self-pay | Admitting: Family Medicine

## 2020-09-03 ENCOUNTER — Ambulatory Visit (INDEPENDENT_AMBULATORY_CARE_PROVIDER_SITE_OTHER): Payer: BC Managed Care – PPO | Admitting: Family Medicine

## 2020-09-03 ENCOUNTER — Other Ambulatory Visit: Payer: Self-pay

## 2020-09-03 VITALS — BP 166/78 | HR 66 | Temp 97.3°F | Resp 15 | Wt 131.8 lb

## 2020-09-03 DIAGNOSIS — M79645 Pain in left finger(s): Secondary | ICD-10-CM

## 2020-09-03 DIAGNOSIS — L309 Dermatitis, unspecified: Secondary | ICD-10-CM

## 2020-09-03 DIAGNOSIS — M76892 Other specified enthesopathies of left lower limb, excluding foot: Secondary | ICD-10-CM | POA: Diagnosis not present

## 2020-09-03 DIAGNOSIS — M25542 Pain in joints of left hand: Secondary | ICD-10-CM

## 2020-09-03 DIAGNOSIS — M25541 Pain in joints of right hand: Secondary | ICD-10-CM | POA: Diagnosis not present

## 2020-09-03 DIAGNOSIS — E559 Vitamin D deficiency, unspecified: Secondary | ICD-10-CM | POA: Diagnosis not present

## 2020-09-03 DIAGNOSIS — Z0001 Encounter for general adult medical examination with abnormal findings: Secondary | ICD-10-CM

## 2020-09-03 DIAGNOSIS — R739 Hyperglycemia, unspecified: Secondary | ICD-10-CM | POA: Diagnosis not present

## 2020-09-03 DIAGNOSIS — M25751 Osteophyte, right hip: Secondary | ICD-10-CM | POA: Diagnosis not present

## 2020-09-03 DIAGNOSIS — M7989 Other specified soft tissue disorders: Secondary | ICD-10-CM | POA: Diagnosis not present

## 2020-09-03 DIAGNOSIS — M19042 Primary osteoarthritis, left hand: Secondary | ICD-10-CM | POA: Diagnosis not present

## 2020-09-03 DIAGNOSIS — M160 Bilateral primary osteoarthritis of hip: Secondary | ICD-10-CM | POA: Diagnosis not present

## 2020-09-03 DIAGNOSIS — M25551 Pain in right hip: Secondary | ICD-10-CM | POA: Diagnosis not present

## 2020-09-03 DIAGNOSIS — M25552 Pain in left hip: Secondary | ICD-10-CM

## 2020-09-03 DIAGNOSIS — E785 Hyperlipidemia, unspecified: Secondary | ICD-10-CM | POA: Diagnosis not present

## 2020-09-03 DIAGNOSIS — E2839 Other primary ovarian failure: Secondary | ICD-10-CM

## 2020-09-03 LAB — CBC
HCT: 36.2 % (ref 36.0–46.0)
Hemoglobin: 11.8 g/dL — ABNORMAL LOW (ref 12.0–15.0)
MCHC: 32.6 g/dL (ref 30.0–36.0)
MCV: 83.8 fl (ref 78.0–100.0)
Platelets: 272 10*3/uL (ref 150.0–400.0)
RBC: 4.32 Mil/uL (ref 3.87–5.11)
RDW: 16.8 % — ABNORMAL HIGH (ref 11.5–15.5)
WBC: 4.6 10*3/uL (ref 4.0–10.5)

## 2020-09-03 LAB — COMPREHENSIVE METABOLIC PANEL
ALT: 11 U/L (ref 0–35)
AST: 21 U/L (ref 0–37)
Albumin: 4.2 g/dL (ref 3.5–5.2)
Alkaline Phosphatase: 56 U/L (ref 39–117)
BUN: 12 mg/dL (ref 6–23)
CO2: 28 mEq/L (ref 19–32)
Calcium: 9.3 mg/dL (ref 8.4–10.5)
Chloride: 105 mEq/L (ref 96–112)
Creatinine, Ser: 0.79 mg/dL (ref 0.40–1.20)
GFR: 79.52 mL/min (ref >60.00)
Glucose, Bld: 81 mg/dL (ref 70–99)
Potassium: 4 mEq/L (ref 3.5–5.1)
Sodium: 141 mEq/L (ref 135–145)
Total Bilirubin: 0.4 mg/dL (ref 0.2–1.2)
Total Protein: 7 g/dL (ref 6.0–8.3)

## 2020-09-03 LAB — HEMOGLOBIN A1C: Hgb A1c MFr Bld: 5.6 % (ref 4.6–6.5)

## 2020-09-03 LAB — LIPID PANEL
Cholesterol: 185 mg/dL (ref 0–200)
HDL: 82 mg/dL (ref >39.00)
LDL Cholesterol: 90 mg/dL (ref 0–99)
NonHDL: 103.15
Total CHOL/HDL Ratio: 2
Triglycerides: 67 mg/dL (ref 0.0–149.0)
VLDL: 13.4 mg/dL (ref 0.0–40.0)

## 2020-09-03 LAB — TSH: TSH: 1.6 u[IU]/mL (ref 0.35–4.50)

## 2020-09-03 LAB — VITAMIN D 25 HYDROXY (VIT D DEFICIENCY, FRACTURES): VITD: 28.81 ng/mL — ABNORMAL LOW (ref 30.00–100.00)

## 2020-09-03 LAB — VITAMIN B12: Vitamin B-12: 793 pg/mL (ref 211–911)

## 2020-09-03 NOTE — Assessment & Plan Note (Signed)
Check lipids today.  Continue pravastatin 20 mg daily.  Discussed lifestyle modifications.

## 2020-09-03 NOTE — Progress Notes (Signed)
Chief Complaint:  Robin Stevens is a 65 y.o. female who presents today for her annual comprehensive physical exam.    Assessment/Plan:  New/Acute Problems: Dermatitis Recommended topical cortisone cream.  Chronic Problems Addressed Today: Dyslipidemia Check lipids today.  Continue pravastatin 20 mg daily.  Discussed lifestyle modifications.  Arthralgia of both hands Likely osteoarthritis.  Pain is not controlled. Will check plain film today and labs.  Recommended compression, ice.  Also recommend Voltaren gel.  Vitamin D deficiency Check vitamin D level.  Bilateral hip pain Likely osteoarthritis.  No red flag findings.  Discussed conservative measures including ice/heat.  She can continue using over-the-counter meds.  Recommended Voltaren gel.  We will check x-rays today  Body mass index is 25.74 kg/m. / Overweight  BMI Metric Follow Up - 09/03/20 0845       BMI Metric Follow Up-Please document annually   BMI Metric Follow Up Education provided             Preventative Healthcare: Shingrix given today.  We will place order for DEXA scan.  Up-to-date on Pap and colon cancer screening.  Check labs today.  Patient Counseling(The following topics were reviewed and/or handout was given):  -Nutrition: Stressed importance of moderation in sodium/caffeine intake, saturated fat and cholesterol, caloric balance, sufficient intake of fresh fruits, vegetables, and fiber.  -Stressed the importance of regular exercise.   -Substance Abuse: Discussed cessation/primary prevention of tobacco, alcohol, or other drug use; driving or other dangerous activities under the influence; availability of treatment for abuse.   -Injury prevention: Discussed safety belts, safety helmets, smoke detector, smoking near bedding or upholstery.   -Sexuality: Discussed sexually transmitted diseases, partner selection, use of condoms, avoidance of unintended pregnancy and contraceptive alternatives.   -Dental  health: Discussed importance of regular tooth brushing, flossing, and dental visits.  -Health maintenance and immunizations reviewed. Please refer to Health maintenance section.  Return to care in 1 year for next preventative visit.     Subjective:  HPI: She complains of hip pain, back pain, left hand pain and an itchy scalp behind both ears. She treats her scalp using aveeno, but it still itches and believes the cause is possibly wearing masks. The back pain radiates from the right hip across the lower back. She denies shooting pain or numbness in lower legs, and admits to aching pain in lower legs. For her left hand, she wears a compression glove to help alleviate the pain across her thumb.  She has been unable to keep up her walking regimen due to the summer heat, but this lack of walking has correlated with a decrease in pain in her  hip. The pain has been ongoing for a year. She admits to a FMHx of rheumatoid arthritis on her mother's side, and arthritis on her father's side.   Compliant with cholesterol medication, with no notable side effects.   Lifestyle Diet: Has a reasonably healthy diet, but should reduce cholesterol intake. Always tries to improve.  Exercise: Does yoga, and strength training. Kayaks or hikes on the weekends.   Depression screen [facility] 2/9 09/03/2020  Decreased Interest 0  Down, Depressed, Hopeless 0  PHQ - 2 Score 0  Altered sleeping -  Tired, decreased energy -  Change in appetite -  Feeling bad or failure about yourself  -  Trouble concentrating -  Moving slowly or fidgety/restless -  Suicidal thoughts -  PHQ-9 Score -  Difficult doing work/chores -    Health Maintenance Due  Topic  Date Due   DEXA SCAN  Never done   Zoster Vaccines- Shingrix (1 of 2) Never done   PAP SMEAR-Modifier  08/17/2020     ROS: Per HPI, otherwise a complete review of systems was negative.   PMH:  The following were reviewed and entered/updated in epic: Past Medical  History:  Diagnosis Date   Cataract    GERD (gastroesophageal reflux disease)    History of chicken pox    Macular degeneration of both eyes 2016   Patient Active Problem List   Diagnosis Date Noted   Dyslipidemia 09/03/2020   Environmental and seasonal allergies 08/23/2018   Vitamin D deficiency 08/17/2017   Arthralgia of both hands 08/17/2017   Macular degeneration of both eyes 03/10/2014   Past Surgical History:  Procedure Laterality Date   HERNIA REPAIR Bilateral 1999   Inguinal    removal of ovarian cyst Right 1995   THROAT SURGERY  2009   Zinkers Diverticulum   VAGINAL DELIVERY  1985, 1987, 1989    Family History  Problem Relation Age of Onset   Arthritis Mother    Heart attack Mother    Heart disease Mother    Pancreatic cancer Father    Early death Maternal Grandmother 70       cardiac-related   Heart disease Maternal Grandfather     Medications- reviewed and updated Current Outpatient Medications  Medication Sig Dispense Refill   b complex vitamins capsule Take 1 capsule by mouth daily.     calcium citrate-vitamin D (CITRACAL+D) 315-200 MG-UNIT tablet Take 1 tablet by mouth daily.     Dietary Management Product (TOZAL) CAPS Take 1 capsule by mouth daily.     Glucosamine HCl (GLUCOSAMINE PO) Take by mouth.     ibuprofen (ADVIL,MOTRIN) 200 MG tablet Take 200 mg by mouth as needed.     ipratropium (ATROVENT) 0.03 % nasal spray Place 2 sprays into both nostrils every 12 (twelve) hours. 30 mL 3   loratadine (CLARITIN) 10 MG tablet Take 10 mg by mouth daily.     Magnesium 400 MG TABS Take 1 tablet by mouth daily.     pravastatin (PRAVACHOL) 20 MG tablet TAKE 1 TABLET BY MOUTH EVERY DAY 90 tablet 1   fluticasone (FLONASE) 50 MCG/ACT nasal spray Place 2 sprays into both nostrils daily. (Patient not taking: Reported on 09/03/2020) 16 g 2   No current facility-administered medications for this visit.    Allergies-reviewed and updated Allergies  Allergen Reactions    Sulfa Antibiotics Nausea Only    Social History   Socioeconomic History   Marital status: Married    Spouse name: Not on file   Number of children: Not on file   Years of education: Not on file   Highest education level: Not on file  Occupational History   Not on file  Tobacco Use   Smoking status: Never   Smokeless tobacco: Never  Vaping Use   Vaping Use: Never used  Substance and Sexual Activity   Alcohol use: Yes    Comment: socially   Drug use: Never   Sexual activity: Yes  Other Topics Concern   Not on file  Social History Narrative   From [place] here in Oct 2017 with husband   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 Wt Readings from Last 3 Encounters:  09/03/20 131 lb 12.8 oz (59.8 kg)  08/24/19 129 lb (58.5 kg)  08/23/18 131 lb 4 oz (59.5 kg)   Gen: NAD, resting comfortably HEENT: TMs normal bilaterally. OP clear. No thyromegaly noted.  CV: RRR with no murmurs appreciated Pulm: NWOB, CTAB with no crackles, wheezes, or rhonchi  GI: Normal bowel sounds present. Soft, Nontender, Nondistended. MSK: no edema, cyanosis, or clubbing noted - Back: No deformities.  Nontender to palpation - Bilateral hips: No deformities.  Normal internal and external rotation bilaterally - Knees: No deformities.  Full range of motion.  Slight crepitus with active and passive range of motion.  Neurovascular intact distally - Left hand: Hypertrophic joints noted.  Pain with axial loading of left thumb at her [facility]  joint. Skin: warm, dry.  Small amount of dermatitis behind bilateral ears. Neuro: CN2-12 grossly intact. Strength 5/5 in upper and lower extremities. Reflexes symmetric and intact bilaterally.  Psych: Normal affect and thought content     I,Jordan Kelly,acting as a scribe for Dimas Chyle, MD.,have documented all relevant documentation on the behalf of Dimas Chyle, MD,as directed by  Dimas Chyle, MD while in the presence of Dimas Chyle, MD.  I, Dimas Chyle, MD, have reviewed all documentation for this visit. The documentation on 09/03/20 for the exam, diagnosis, procedures, and orders are all accurate and complete.   Algis Greenhouse. Jerline Pain, MD 09/03/2020 8:45 AM

## 2020-09-03 NOTE — Assessment & Plan Note (Addendum)
Likely osteoarthritis.  Pain is not controlled. Will check plain film today and labs.  Recommended compression, ice.  Also recommend Voltaren gel.

## 2020-09-03 NOTE — Assessment & Plan Note (Signed)
Check vitamin D level 

## 2020-09-03 NOTE — Patient Instructions (Signed)
It was very nice to see you today!  We will check blood work today.  We will also check x-rays of your hip and thumb.  I think you probably have arthritis.  Please try using over-the-counter Voltaren gel.  Please also continue using your compression and ice/heat.  Please try using cortisone cream for the irritation behind your ears.  We will order a bone density scan.  We will give your Shingrix vaccine today.  Please come back in 1 year for your next physical.  Come back sooner if needed.  Take care, Dr Jerline Pain  PLEASE NOTE:  If you had any lab tests please let us know if you have not heard back within a few days. You may see your results on mychart before we have a chance to review them but we will give you a call once they are reviewed by [place]. If we ordered any referrals today, please let us know if you have not heard from their office within the next week.   Please try these tips to maintain a healthy lifestyle:  Eat at least 3 REAL meals and 1-2 snacks per day.  Aim for no more than 5 hours between eating.  If you eat breakfast, please do so within one hour of getting up.   Each meal should contain half fruits/vegetables, one quarter protein, and one quarter carbs (no bigger than a computer mouse)  Cut down on sweet beverages. This includes juice, soda, and sweet tea.   Drink at least 1 glass of water with each meal and aim for at least 8 glasses per day  Exercise at least 150 minutes every week.    Preventive Care 64-8 Years Old, Female Preventive care refers to lifestyle choices and visits with your health care provider that can promote health and wellness. This includes: A yearly physical exam. This is also called an annual wellness visit. Regular dental and eye exams. Immunizations. Screening for certain conditions. Healthy lifestyle choices, such as: Eating a healthy diet. Getting regular exercise. Not using drugs or products that contain nicotine and  tobacco. Limiting alcohol use. What can I expect for my preventive care visit? Physical exam Your health care provider will check your: Height and weight. These may be used to calculate your BMI (body mass index). BMI is a measurement that tells if you are at a healthy weight. Heart rate and blood pressure. Body temperature. Skin for abnormal spots. Counseling Your health care provider may ask you questions about your: Past medical problems. Family's medical history. Alcohol, tobacco, and drug use. Emotional well-being. Home life and relationship well-being. Sexual activity. Diet, exercise, and sleep habits. Work and work Statistician. Access to firearms. Method of birth control. Menstrual cycle. Pregnancy history. What immunizations do I need?  Vaccines are usually given at various ages, according to a schedule. Your health care provider will recommend vaccines for you based on your age, medicalhistory, and lifestyle or other factors, such as travel or where you work. What tests do I need? Blood tests Lipid and cholesterol levels. These may be checked every 5 years, or more often if you are over 64 years old. Hepatitis C test. Hepatitis B test. Screening Lung cancer screening. You may have this screening every year starting at age 64 if you have a 30-pack-year history of smoking and currently smoke or have quit within the past 15 years. Colorectal cancer screening. All adults should have this screening starting at age 64 and continuing until age 64. Your health care  provider may recommend screening at age 64 if you are at increased risk. You will have tests every 1-10 years, depending on your results and the type of screening test. Diabetes screening. This is done by checking your blood sugar (glucose) after you have not eaten for a while (fasting). You may have this done every 1-3 years. Mammogram. This may be done every 1-2 years. Talk with your health care provider about  when you should start having regular mammograms. This may depend on whether you have a family history of breast cancer. BRCA-related cancer screening. This may be done if you have a family history of breast, ovarian, tubal, or peritoneal cancers. Pelvic exam and Pap test. This may be done every 3 years starting at age 64. Starting at age 64, this may be done every 5 years if you have a Pap test in combination with an HPV test. Other tests STD (sexually transmitted disease) testing, if you are at risk. Bone density scan. This is done to screen for osteoporosis. You may have this scan if you are at high risk for osteoporosis. Talk with your health care provider about your test results, treatment options,and if necessary, the need for more tests. Follow these instructions at home: Eating and drinking  Eat a diet that includes fresh fruits and vegetables, whole grains, lean protein, and low-fat dairy products. Take vitamin and mineral supplements as recommended by your health care provider. Do not drink alcohol if: Your health care provider tells you not to drink. You are pregnant, may be pregnant, or are planning to become pregnant. If you drink alcohol: Limit how much you have to 0-1 drink a day. Be aware of how much alcohol is in your drink. In the [place] one drink equals one 12 oz bottle of beer (355 mL), one 5 oz glass of wine (148 mL), or one 1 oz glass of hard liquor (44 mL).  Lifestyle Take daily care of your teeth and gums. Brush your teeth every morning and night with fluoride toothpaste. Floss one time each day. Stay active. Exercise for at least 30 minutes 5 or more days each week. Do not use any products that contain nicotine or tobacco, such as cigarettes, e-cigarettes, and chewing tobacco. If you need help quitting, ask your health care provider. Do not use drugs. If you are sexually active, practice safe sex. Use a condom or other form of protection to prevent STIs (sexually  transmitted infections). If you do not wish to become pregnant, use a form of birth control. If you plan to become pregnant, see your health care provider for a prepregnancy visit. If told by your health care provider, take low-dose aspirin daily starting at age 16. Find healthy ways to cope with stress, such as: Meditation, yoga, or listening to music. Journaling. Talking to a trusted person. Spending time with friends and family. Safety Always wear your seat belt while driving or riding in a vehicle. Do not drive: If you have been drinking alcohol. Do not ride with someone who has been drinking. When you are tired or distracted. While texting. Wear a helmet and other protective equipment during sports activities. If you have firearms in your house, make sure you follow all gun safety procedures. What's next? Visit your health care provider once a year for an annual wellness visit. Ask your health care provider how often you should have your eyes and teeth checked. Stay up to date on all vaccines. This information is not intended to replace advice  given to you by your health care provider. Make sure you discuss any questions you have with your healthcare provider. Document Revised: 11/29/2019 Document Reviewed: 11/05/2017 Elsevier Patient Education  2022 [employer].

## 2020-09-04 ENCOUNTER — Other Ambulatory Visit: Payer: Self-pay

## 2020-09-04 DIAGNOSIS — D649 Anemia, unspecified: Secondary | ICD-10-CM

## 2020-09-04 NOTE — Progress Notes (Signed)
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 She can continue with the treatment plan we discussed at her office visit but would like for her to let us know if her symptoms become unmanageable.  Algis Greenhouse. Jerline Pain, MD 09/04/2020 8:38 AM

## 2020-09-04 NOTE — Progress Notes (Signed)
Please inform patient of the following:  Her vitamin D level is low. Recommend she take 1000IU daily and we can recheck in 3-6 months.  Her hemoglobin dropped a bit since last time. Would like for her to come back in 1-2 weeks to recheck CBC.  Everything else is NORMAL and can be rechecked in a year.

## 2020-09-14 DIAGNOSIS — H353132 Nonexudative age-related macular degeneration, bilateral, intermediate dry stage: Secondary | ICD-10-CM | POA: Diagnosis not present

## 2020-12-04 ENCOUNTER — Ambulatory Visit (INDEPENDENT_AMBULATORY_CARE_PROVIDER_SITE_OTHER): Payer: BC Managed Care – PPO

## 2020-12-04 ENCOUNTER — Other Ambulatory Visit: Payer: Self-pay

## 2020-12-04 ENCOUNTER — Other Ambulatory Visit (INDEPENDENT_AMBULATORY_CARE_PROVIDER_SITE_OTHER): Payer: BC Managed Care – PPO

## 2020-12-04 DIAGNOSIS — D649 Anemia, unspecified: Secondary | ICD-10-CM | POA: Diagnosis not present

## 2020-12-04 DIAGNOSIS — Z23 Encounter for immunization: Secondary | ICD-10-CM

## 2020-12-04 LAB — CBC WITH DIFFERENTIAL/PLATELET
Basophils Absolute: 0 10*3/uL (ref 0.0–0.1)
Basophils Relative: 0.8 % (ref 0.0–3.0)
Eosinophils Absolute: 0.1 10*3/uL (ref 0.0–0.7)
Eosinophils Relative: 1.5 % (ref 0.0–5.0)
HCT: 35.5 % — ABNORMAL LOW (ref 36.0–46.0)
Hemoglobin: 11.2 g/dL — ABNORMAL LOW (ref 12.0–15.0)
Lymphocytes Relative: 34.9 % (ref 12.0–46.0)
Lymphs Abs: 1.7 10*3/uL (ref 0.7–4.0)
MCHC: 31.7 g/dL (ref 30.0–36.0)
MCV: 81.6 fl (ref 78.0–100.0)
Monocytes Absolute: 0.4 10*3/uL (ref 0.1–1.0)
Monocytes Relative: 9.4 % (ref 3.0–12.0)
Neutro Abs: 2.5 10*3/uL (ref 1.4–7.7)
Neutrophils Relative %: 53.4 % (ref 43.0–77.0)
Platelets: 253 10*3/uL (ref 150.0–400.0)
RBC: 4.35 Mil/uL (ref 3.87–5.11)
RDW: 15.3 % (ref 11.5–15.5)
WBC: 4.8 10*3/uL (ref 4.0–10.5)

## 2020-12-05 ENCOUNTER — Other Ambulatory Visit: Payer: Self-pay | Admitting: Physician Assistant

## 2020-12-05 ENCOUNTER — Encounter: Payer: Self-pay | Admitting: Family Medicine

## 2020-12-05 ENCOUNTER — Encounter: Payer: Self-pay | Admitting: Physician Assistant

## 2020-12-05 DIAGNOSIS — D649 Anemia, unspecified: Secondary | ICD-10-CM

## 2020-12-05 NOTE — Telephone Encounter (Signed)
Duplicate

## 2020-12-12 ENCOUNTER — Other Ambulatory Visit (INDEPENDENT_AMBULATORY_CARE_PROVIDER_SITE_OTHER): Payer: BC Managed Care – PPO

## 2020-12-12 DIAGNOSIS — D649 Anemia, unspecified: Secondary | ICD-10-CM

## 2020-12-13 ENCOUNTER — Encounter: Payer: Self-pay | Admitting: Physician Assistant

## 2020-12-13 ENCOUNTER — Other Ambulatory Visit: Payer: Self-pay | Admitting: Physician Assistant

## 2020-12-13 DIAGNOSIS — D649 Anemia, unspecified: Secondary | ICD-10-CM

## 2020-12-13 LAB — FECAL OCCULT BLOOD, IMMUNOCHEMICAL: Fecal Occult Bld: NEGATIVE

## 2020-12-31 ENCOUNTER — Encounter: Payer: Self-pay | Admitting: Physician Assistant

## 2021-01-02 NOTE — Progress Notes (Signed)
Robin Stevens is a 65 y.o. female here for a possible UTI.   History of Present Illness:   Chief Complaint  Patient presents with   Urinary Frequency         HPI  UTI Robin Stevens presents with c/o urinary frequency that have been onset for about a week. She states she tries to increase her water intake but recently hasn't been doing that well of a job at it. Robin Stevens has been using OTC Azo to treat her symptoms which has provided minor relief. Robin Stevens also expresses she has recently been experiencing rectal pressure. Denies rectal bleeding or changes to BM.  Sinusitis Went to CT over the weekend. Developed head cold at that time. Has HA today. Has not tested for COVID. Planning to get booster soon. Denies fevers, chills, vomiting.  Anemia She is here for repeat blood work for decreased hemoglobin.  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   VAGINAL DELIVERY  1985, 1987, 1989    Family History  Problem Relation Age of Onset   Arthritis Mother    Heart attack Mother    Heart disease Mother    Pancreatic cancer Father    Early death Maternal Grandmother 40       cardiac-related   Heart disease Maternal Grandfather     Allergies  Allergen Reactions   Sulfa Antibiotics Nausea Only    Current Medications:   Current Outpatient Medications:    b complex vitamins capsule, Take 1 capsule by mouth daily., Disp: , Rfl:    calcium citrate-vitamin D (CITRACAL+D) 315-200 MG-UNIT  tablet, Take 1 tablet by mouth daily., Disp: , Rfl:    Dietary Management Product (TOZAL) CAPS, Take 1 capsule by mouth daily., Disp: , Rfl:    Glucosamine HCl (GLUCOSAMINE PO), Take by mouth., Disp: , Rfl:    ibuprofen (ADVIL,MOTRIN) 200 MG tablet, Take 200 mg by mouth as needed., Disp: , Rfl:    ipratropium (ATROVENT) 0.03 % nasal spray, Place 2 sprays into both nostrils every 12 (twelve) hours., Disp: 30 mL, Rfl: 3   loratadine (CLARITIN) 10 MG tablet, Take 10 mg by mouth daily., Disp: , Rfl:    Magnesium 400 MG TABS, Take 1 tablet by mouth daily., Disp: , Rfl:    pravastatin (PRAVACHOL) 20 MG tablet, TAKE 1 TABLET BY MOUTH EVERY DAY, Disp: 90 tablet, Rfl: 1   Review of Systems:   ROS Negative unless otherwise specified per HPI. Vitals:   Vitals:   01/03/21 0857  BP: 126/80  Pulse: 89  Temp: 98.6 F (37 C)  TempSrc: Temporal  SpO2: 97%  Weight: 133 lb (60.3 kg)  Height: 5' (1.524 m)     Body mass index is 25.97 kg/m.  Physical Exam:   Physical Exam Constitutional:      Appearance: Normal appearance. She is well-developed.  HENT:  Head: Normocephalic and atraumatic.  Eyes:     General: Lids are normal.     Extraocular Movements: Extraocular movements intact.     Conjunctiva/sclera: Conjunctivae normal.  Pulmonary:     Effort: Pulmonary effort is normal.  Abdominal:     Tenderness: There is no right CVA tenderness or left CVA tenderness.  Musculoskeletal:        General: Normal range of motion.     Cervical back: Normal range of motion and neck supple.  Skin:    General: Skin is warm and dry.  Neurological:     Mental Status: She is alert and oriented to person, place, and time.  Psychiatric:        Attention and Perception: Attention and perception normal.        Mood and Affect: Mood normal.        Behavior: Behavior normal.        Thought Content: Thought content normal.        Judgment: Judgment normal.  Results for orders placed or performed in visit  on 01/03/21  POCT urinalysis dipstick  Result Value Ref Range   Color, UA yellow    Clarity, UA clear neg    Glucose, UA Negative Negative   Bilirubin, UA neg    Ketones, UA neg    Spec Grav, UA 1.020 1.010 - 1.025   Blood, UA positive    pH, UA 6.0 5.0 - 8.0   Protein, UA Positive (A) Negative   Urobilinogen, UA 0.2 0.2 or 1.0 E.U./dL   Nitrite, UA neg    Leukocytes, UA Trace (A) Negative   Appearance     Odor       Assessment and Plan:   Urinary frequency Will treat for acute cystitis based on symptoms, however she did take AZO so I did tell her that we are unable to accurately assess her UA Urine culture ordered I also recommended that she let us know if her rectal pain continues Follow-up if worsening or lack of improvement  Anemia, unspecified type Update blood work today and provide recommendations accordingly  Upper respiratory tract infection, unspecified type No red flags I did recommend that she test for COVID, as she is planning to get her booster soon Follow-up if new/worsening symptoms   I,Robin Stevens,acting as a Education administrator for [employer], PA.,have documented all relevant documentation on the behalf of Robin Coke, PA,as directed by  Robin Coke, PA while in the presence of Robin Stevens, [place].   I, Robin Stevens, [place], have reviewed all documentation for this visit. The documentation on 01/03/21 for the exam, diagnosis, procedures, and orders are all accurate and complete.   Robin Coke, PA-C

## 2021-01-03 ENCOUNTER — Encounter: Payer: Self-pay | Admitting: Physician Assistant

## 2021-01-03 ENCOUNTER — Other Ambulatory Visit: Payer: BC Managed Care – PPO

## 2021-01-03 ENCOUNTER — Other Ambulatory Visit: Payer: Self-pay

## 2021-01-03 ENCOUNTER — Ambulatory Visit: Payer: BC Managed Care – PPO | Admitting: Physician Assistant

## 2021-01-03 VITALS — BP 126/80 | HR 89 | Temp 98.6°F | Ht 60.0 in | Wt 133.0 lb

## 2021-01-03 DIAGNOSIS — J069 Acute upper respiratory infection, unspecified: Secondary | ICD-10-CM | POA: Diagnosis not present

## 2021-01-03 DIAGNOSIS — R350 Frequency of micturition: Secondary | ICD-10-CM

## 2021-01-03 DIAGNOSIS — D649 Anemia, unspecified: Secondary | ICD-10-CM

## 2021-01-03 LAB — POCT URINALYSIS DIPSTICK
Bilirubin, UA: NEGATIVE
Blood, UA: POSITIVE
Clarity, UA: NEGATIVE
Glucose, UA: NEGATIVE
Ketones, UA: NEGATIVE
Nitrite, UA: NEGATIVE
Protein, UA: POSITIVE — AB
Spec Grav, UA: 1.02 (ref 1.010–1.025)
Urobilinogen, UA: 0.2 E.U./dL
pH, UA: 6 (ref 5.0–8.0)

## 2021-01-03 LAB — CBC WITH DIFFERENTIAL/PLATELET
Basophils Absolute: 0 10*3/uL (ref 0.0–0.1)
Basophils Relative: 0.2 % (ref 0.0–3.0)
Eosinophils Absolute: 0 10*3/uL (ref 0.0–0.7)
Eosinophils Relative: 0.3 % (ref 0.0–5.0)
HCT: 36.4 % (ref 36.0–46.0)
Hemoglobin: 11.6 g/dL — ABNORMAL LOW (ref 12.0–15.0)
Lymphocytes Relative: 13 % (ref 12.0–46.0)
Lymphs Abs: 1.2 10*3/uL (ref 0.7–4.0)
MCHC: 31.8 g/dL (ref 30.0–36.0)
MCV: 80.7 fl (ref 78.0–100.0)
Monocytes Absolute: 1 10*3/uL (ref 0.1–1.0)
Monocytes Relative: 11.5 % (ref 3.0–12.0)
Neutro Abs: 6.8 10*3/uL (ref 1.4–7.7)
Neutrophils Relative %: 75 % (ref 43.0–77.0)
Platelets: 241 10*3/uL (ref 150.0–400.0)
RBC: 4.51 Mil/uL (ref 3.87–5.11)
RDW: 16.4 % — ABNORMAL HIGH (ref 11.5–15.5)
WBC: 9.1 10*3/uL (ref 4.0–10.5)

## 2021-01-03 LAB — VITAMIN B12: Vitamin B-12: 721 pg/mL (ref 211–911)

## 2021-01-03 MED ORDER — CEPHALEXIN 500 MG PO CAPS: 500.0000 mg | ORAL_CAPSULE | Freq: Two times a day (BID) | ORAL | 0 refills | Status: DC

## 2021-01-04 LAB — IRON,TIBC AND FERRITIN PANEL
%SAT: 17 % (calc) (ref 16–45)
Ferritin: 8 ng/mL — ABNORMAL LOW (ref 16–288)
Iron: 70 ug/dL (ref 45–160)
TIBC: 417 mcg/dL (calc) (ref 250–450)

## 2021-01-05 LAB — URINE CULTURE
MICRO NUMBER:: 12560005
SPECIMEN QUALITY:: ADEQUATE

## 2021-01-07 ENCOUNTER — Encounter: Payer: Self-pay | Admitting: Physician Assistant

## 2021-01-07 ENCOUNTER — Other Ambulatory Visit: Payer: Self-pay | Admitting: Physician Assistant

## 2021-01-07 DIAGNOSIS — R319 Hematuria, unspecified: Secondary | ICD-10-CM

## 2021-01-21 ENCOUNTER — Other Ambulatory Visit: Payer: Self-pay

## 2021-01-21 ENCOUNTER — Other Ambulatory Visit (INDEPENDENT_AMBULATORY_CARE_PROVIDER_SITE_OTHER): Payer: BC Managed Care – PPO

## 2021-01-21 DIAGNOSIS — R319 Hematuria, unspecified: Secondary | ICD-10-CM

## 2021-01-21 LAB — URINALYSIS, ROUTINE W REFLEX MICROSCOPIC
Bilirubin Urine: NEGATIVE
Hgb urine dipstick: NEGATIVE
Ketones, ur: NEGATIVE
Nitrite: NEGATIVE
Specific Gravity, Urine: 1.01 (ref 1.000–1.030)
Total Protein, Urine: NEGATIVE
Urine Glucose: NEGATIVE
Urobilinogen, UA: 0.2 (ref 0.0–1.0)
pH: 6.5 (ref 5.0–8.0)

## 2021-01-30 ENCOUNTER — Other Ambulatory Visit: Payer: Self-pay | Admitting: Family Medicine

## 2021-04-16 ENCOUNTER — Ambulatory Visit: Payer: BC Managed Care – PPO | Admitting: Physician Assistant

## 2021-04-16 ENCOUNTER — Encounter: Payer: Self-pay | Admitting: Physician Assistant

## 2021-04-16 ENCOUNTER — Other Ambulatory Visit: Payer: Self-pay

## 2021-04-16 VITALS — BP 128/78 | HR 67 | Temp 98.1°F | Ht 60.24 in | Wt 134.6 lb

## 2021-04-16 DIAGNOSIS — R002 Palpitations: Secondary | ICD-10-CM | POA: Diagnosis not present

## 2021-04-16 NOTE — Patient Instructions (Signed)
It was great to see you!  You will be contacted about your Cardiac Calcium Score.  We did your EKG today -- overall looks okay  We can consider the heart monitor - let me know your thoughts on this  Please follow-up with [place] if any further concerns  Take care,  Inda Coke PA-C

## 2021-04-16 NOTE — Progress Notes (Signed)
Robin Stevens is a 66 y.o. female here for a follow up of HLD.   History of Present Illness:   Chief Complaint  Patient presents with   Follow-up    Pt wants to discuss her heart issues and possibly scheduling a heart scan; pt c/o having flutters that's not consistent and has family hx of heart conditions.      HPI  Heart Flutters Robin Stevens reports she has been experiencing what could be described as heart palpitations for the last couple of months. At this time she is not sure if she is just acutely aware of how her heart feels due to her husband's recent heart issues despite his healthy lifestyle or if her fhx of heart disease is playing a part. Currently she is interested in completing a CT calcium score for piece of mind.   Robin Stevens does have a hx of HLD and is currently compliant with taking pravastatin 20 mg daily with no complications. Denies CP, SOB, or LE swelling.    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   VAGINAL DELIVERY  1985, 1987, 1989    Family History  Problem Relation Age of Onset   Arthritis Mother    Heart attack Mother    Heart disease Mother    Pancreatic cancer Father    Early death Maternal Grandmother 40       cardiac-related   Heart disease Maternal Grandfather     Allergies  Allergen Reactions   Sulfa Antibiotics Nausea Only    Current Medications:   Current Outpatient Medications:    b complex vitamins capsule, Take 1 capsule by mouth  daily., Disp: , Rfl:    calcium citrate-vitamin D (CITRACAL+D) 315-200 MG-UNIT tablet, Take 1 tablet by mouth daily., Disp: , Rfl:    Dietary Management Product (TOZAL) CAPS, Take 1 capsule by mouth daily., Disp: , Rfl:    Glucosamine HCl (GLUCOSAMINE PO), Take by mouth., Disp: , Rfl:    ibuprofen (ADVIL,MOTRIN) 200 MG tablet, Take 200 mg by mouth as needed., Disp: , Rfl:    ipratropium (ATROVENT) 0.03 % nasal spray, Place 2 sprays into both nostrils every 12 (twelve) hours., Disp: 30 mL, Rfl: 3   loratadine (CLARITIN) 10 MG tablet, Take 10 mg by mouth daily., Disp: , Rfl:    pravastatin (PRAVACHOL) 20 MG tablet, TAKE 1 TABLET BY MOUTH EVERY DAY, Disp: 90 tablet, Rfl: 1   cephALEXin (KEFLEX) 500 MG capsule, Take 1 capsule (500 mg total) by mouth 2 (two) times daily., Disp: 10 capsule, Rfl: 0   Magnesium 400 MG TABS, Take 1 tablet by mouth daily., Disp: , Rfl:    Review of Systems:   ROS Negative unless otherwise specified per HPI. Vitals:   Vitals:   04/16/21 0820  BP: 128/78  Pulse: 67  Temp: 98.1 F (36.7 C)  TempSrc: Temporal  SpO2: 97%  Weight: 134 lb 9.6 oz (61.1 kg)  Height: 5' 0.24" (1.53 m)     Body mass index is 26.08 kg/m.  Physical Exam:   Physical Exam Vitals and nursing note reviewed.  Constitutional:      General: She is not in acute distress.    Appearance: She is well-developed. She is not ill-appearing or toxic-appearing.  Cardiovascular:     Rate and Rhythm: Normal rate and regular rhythm.     Pulses: Normal pulses.     Heart sounds: Normal heart sounds, S1 normal and S2 normal.  Pulmonary:     Effort: Pulmonary effort is normal.     Breath sounds: Normal breath sounds.  Skin:    General: Skin is warm and dry.  Neurological:     Mental Status: She is alert.     GCS: GCS eye subscore is 4. GCS verbal subscore is 5. GCS motor subscore is 6.  Psychiatric:        Speech: Speech normal.        Behavior: Behavior normal. Behavior is cooperative.     Assessment and Plan:   Palpitations No red flags  EKG tracing is personally reviewed.  EKG notes NSR.  No acute changes.  Discussed ordering a Zio patch but she would like to hold off on this and proceed with CT Calcium scoring first and then potentially proceed with ordering this Will refer to cardiology based on results  Follow up in 4 months for physical, sooner if concerns    I,Robin Stevens,acting as a scribe for [employer], PA.,have documented all relevant documentation on the behalf of Robin Coke, PA,as directed by  Robin Coke, PA while in the presence of Robin Stevens, [place].  I, Robin Stevens, [place], have reviewed all documentation for this visit. The documentation on 04/16/21 for the exam, diagnosis, procedures, and orders are all accurate and complete.   Robin Coke, PA-C

## 2021-05-13 ENCOUNTER — Other Ambulatory Visit: Payer: Self-pay | Admitting: Physician Assistant

## 2021-05-13 DIAGNOSIS — Z1231 Encounter for screening mammogram for malignant neoplasm of breast: Secondary | ICD-10-CM

## 2021-05-15 ENCOUNTER — Ambulatory Visit (INDEPENDENT_AMBULATORY_CARE_PROVIDER_SITE_OTHER): Payer: BC Managed Care – PPO

## 2021-05-15 ENCOUNTER — Other Ambulatory Visit: Payer: Self-pay

## 2021-05-15 DIAGNOSIS — Z1231 Encounter for screening mammogram for malignant neoplasm of breast: Secondary | ICD-10-CM | POA: Diagnosis not present

## 2021-05-16 ENCOUNTER — Other Ambulatory Visit: Payer: Self-pay | Admitting: Physician Assistant

## 2021-05-16 DIAGNOSIS — R928 Other abnormal and inconclusive findings on diagnostic imaging of breast: Secondary | ICD-10-CM

## 2021-05-21 ENCOUNTER — Emergency Department
Admission: EM | Admit: 2021-05-21 | Discharge: 2021-05-21 | Disposition: A | Discharge status: Home / Self Care | Payer: BC Managed Care – PPO | Source: Home / Self Care

## 2021-05-21 ENCOUNTER — Other Ambulatory Visit: Payer: Self-pay

## 2021-05-21 ENCOUNTER — Encounter: Payer: Self-pay | Admitting: Physician Assistant

## 2021-05-21 DIAGNOSIS — R0789 Other chest pain: Secondary | ICD-10-CM | POA: Diagnosis not present

## 2021-05-21 NOTE — ED Triage Notes (Signed)
Pt presents with chest discomfort, chest tightness, and "queasiness" that began at 1AM. ?

## 2021-05-21 NOTE — ED Provider Notes (Signed)
?[facility] ? ? ? ?CSN: [id number] ?Arrival date & time: 05/21/21  1117 ? ? ?  ? ?History   ?Chief Complaint ?Chief Complaint  ?Patient presents with  ? Chest Pain  ? Back Pain  ? ? ?HPI ?Urvi Imes is a 66 y.o. female.  ? ?HPI ? ?Patient  woke up this morning with palpitation and fluttering in her chest.  She had some waves of nausea.  Has had some chest discomfort centrally radiating to axilla and to back.  She is very concerned about the development of heart disease and has seen her PCP.  A cardiac calcium score test is scheduled for the 28 th of this month.  Patient thinks this is a long tome to wait ?Has a pos family history of heart disease ?Husband recently found to have a HIGH calcium score ?Patient is aware that women can have atypical heart symptoms ?Comes to the office stating "I thank I might be having a heart attack" ? ?Past Medical History:  ?Diagnosis Date  ? Cataract   ? GERD (gastroesophageal reflux disease)   ? History of chicken pox   ? Macular degeneration of both eyes 2016  ? ? ?Patient Active Problem List  ? Diagnosis Date Noted  ? Dyslipidemia 09/03/2020  ? Environmental and seasonal allergies 08/23/2018  ? Vitamin D deficiency 08/17/2017  ? Arthralgia of both hands 08/17/2017  ? Macular degeneration of both eyes 03/10/2014  ? ? ?Past Surgical History:  ?Procedure Laterality Date  ? HERNIA REPAIR Bilateral 1999  ? Inguinal   ? removal of ovarian cyst Right 1995  ? THROAT SURGERY  2009  ? Zinkers Diverticulum  ? [place]  ? ? ?OB History   ?No obstetric history on file. ?  ? ? ? ?Home Medications   ? ?Prior to Admission medications   ?Medication Sig Start Date End Date Taking? Authorizing Provider  ?b complex vitamins capsule Take 1 capsule by mouth daily.    [provider]  ?calcium citrate-vitamin D (CITRACAL+D) 315-200 MG-UNIT tablet Take 1 tablet by mouth daily.    [provider]  ?Dietary Management Product (TOZAL) CAPS Take 1  capsule by mouth daily.    [provider]  ?Glucosamine HCl (GLUCOSAMINE PO) Take by mouth. ?Patient not taking: Reported on 05/21/2021    [provider]  ?ibuprofen (ADVIL,MOTRIN) 200 MG tablet Take 200 mg by mouth as needed.    [provider]  ?ipratropium (ATROVENT) 0.03 % nasal spray Place 2 sprays into both nostrils every 12 (twelve) hours. 08/24/19   Inda Coke, PA  ?loratadine (CLARITIN) 10 MG tablet Take 10 mg by mouth daily.    [provider]  ?pravastatin (PRAVACHOL) 20 MG tablet TAKE 1 TABLET BY MOUTH EVERY DAY 01/30/21   Vivi Barrack, MD  ? ? ?Family History ?Family History  ?Problem Relation Age of Onset  ? Arthritis Mother   ? Heart attack Mother   ? Heart disease Mother   ? Pancreatic cancer Father   ? Early death Maternal Grandmother 21  ?     cardiac-related  ? Heart disease Maternal Grandfather   ? ? ?Social History ?Social History  ? ?Tobacco Use  ? Smoking status: Never  ? Smokeless tobacco: Never  ?Vaping Use  ? Vaping Use: Never used  ?Substance Use Topics  ? Alcohol use: Yes  ?  Comment: socially  ? Drug use: Never  ? ? ? ?Allergies   ?Sulfa antibiotics ? ? ?  Review of Systems ?Review of Systems ?See HPI ? ?Physical Exam ?Triage Vital Signs ?ED Triage Vitals  ?Enc Vitals Group  ?   BP 05/21/21 1123 (!) 174/93  ?   Pulse Rate 05/21/21 1123 67  ?   Resp 05/21/21 1123 14  ?   Temp 05/21/21 1123 98.2 ?F (36.8 ?C)  ?   Temp Source 05/21/21 1123 Oral  ?   SpO2 05/21/21 1123 98 %  ?   Weight --   ?   Height --   ?   Head Circumference --   ?   Peak Flow --   ?   Pain Score 05/21/21 1124 0  ?   Pain Loc --   ?   Pain Edu? --   ?   Excl. in [place]? --   ? ?No data found. ? ?Updated Vital Signs ?BP 136/84 (BP Location: Left Arm)   Pulse 67   Temp 98.2 ?F (36.8 ?C) (Oral)   Resp 14   SpO2 98%  ? ?   ? ?Physical Exam ?Constitutional:   ?   General: She is not in acute distress. ?   Appearance: She is well-developed. She is not ill-appearing.  ?HENT:  ?   Head:  Normocephalic and atraumatic.  ?   Mouth/Throat:  ?   Comments: mask ?Eyes:  ?   Conjunctiva/sclera: Conjunctivae normal.  ?   Pupils: Pupils are equal, round, and reactive to light.  ?Cardiovascular:  ?   Rate and Rhythm: Normal rate and regular rhythm.  ?   Heart sounds: Normal heart sounds.  ?Pulmonary:  ?   Effort: Pulmonary effort is normal. No respiratory distress.  ?   Breath sounds: Normal breath sounds.  ?Abdominal:  ?   General: There is no distension.  ?   Palpations: Abdomen is soft.  ?Musculoskeletal:     ?   General: Normal range of motion.  ?   Cervical back: Normal range of motion.  ?   Right lower leg: No edema.  ?   Left lower leg: No edema.  ?Skin: ?   General: Skin is warm and dry.  ?Neurological:  ?   Mental Status: She is alert.  ?Psychiatric:     ?   Mood and Affect: Mood normal.     ?   Behavior: Behavior normal.  ? ? ? ?UC Treatments / Results  ?Labs ?(all labs ordered are listed, but only abnormal results are displayed) ?Labs Reviewed - No data to display ? ?EKG ? ? ?Radiology ?No results found. ? ?Procedures ?Procedures (including critical care time) ? ?Medications Ordered in UC ?Medications - No data to display ? ?Initial Impression / Assessment and Plan / UC Course  ?I have reviewed the triage vital signs and the nursing notes. ? ?Pertinent labs & imaging results that were available during my care of the patient were reviewed by me and considered in my medical decision making (see chart for details). ? ?  ? ?Reviewed normal EKG ?Reviewed that anxiety may be playing a role ?Told patient I CAN NOT rule out heart disease at an urgent care visit ?Called patient's PCP to request that heart test or cardiology consult be scheduled sooner ?Final Clinical Impressions(s) / UC Diagnoses  ? ?Final diagnoses:  ?Atypical chest pain  ? ? ? ?Discharge Instructions   ? ?  ?Your EKG is reassuring that you are NOT having a heart attack - however this cannot be certain without additional testing ?Home to  rest ?GO TO ER if worse ?Follow up with your PCP ? ? ?ED Prescriptions   ?None ?  ? ?PDMP not reviewed this encounter. ?  ?Raylene Everts, MD ?05/21/21 1349 ? ?

## 2021-05-21 NOTE — Discharge Instructions (Signed)
Your EKG is reassuring that you are NOT having a heart attack - however this cannot be certain without additional testing ?Home to rest ?GO TO ER if worse ?Follow up with your PCP ?

## 2021-06-04 ENCOUNTER — Other Ambulatory Visit: Payer: Self-pay

## 2021-06-04 ENCOUNTER — Ambulatory Visit (INDEPENDENT_AMBULATORY_CARE_PROVIDER_SITE_OTHER)
Admission: RE | Admit: 2021-06-04 | Discharge: 2021-06-04 | Disposition: A | Discharge status: Home / Self Care | Payer: Self-pay | Source: Ambulatory Visit | Attending: Physician Assistant | Admitting: Physician Assistant

## 2021-06-04 DIAGNOSIS — R002 Palpitations: Secondary | ICD-10-CM

## 2021-06-05 DIAGNOSIS — H353132 Nonexudative age-related macular degeneration, bilateral, intermediate dry stage: Secondary | ICD-10-CM | POA: Diagnosis not present

## 2021-06-07 ENCOUNTER — Other Ambulatory Visit: Payer: Self-pay | Admitting: Physician Assistant

## 2021-06-07 ENCOUNTER — Encounter: Payer: Self-pay | Admitting: Physician Assistant

## 2021-06-07 ENCOUNTER — Ambulatory Visit
Admission: RE | Admit: 2021-06-07 | Discharge: 2021-06-07 | Disposition: A | Discharge status: Home / Self Care | Payer: BC Managed Care – PPO | Source: Ambulatory Visit | Attending: Physician Assistant | Admitting: Physician Assistant

## 2021-06-07 DIAGNOSIS — R922 Inconclusive mammogram: Secondary | ICD-10-CM | POA: Diagnosis not present

## 2021-06-07 DIAGNOSIS — R928 Other abnormal and inconclusive findings on diagnostic imaging of breast: Secondary | ICD-10-CM

## 2021-06-07 MED ORDER — PRAVASTATIN SODIUM 40 MG PO TABS: 40.0000 mg | ORAL_TABLET | Freq: Every day | ORAL | 1 refills | Status: DC

## 2021-06-21 ENCOUNTER — Ambulatory Visit
Admission: RE | Admit: 2021-06-21 | Discharge: 2021-06-21 | Disposition: A | Discharge status: Home / Self Care | Payer: BC Managed Care – PPO | Source: Ambulatory Visit | Attending: Physician Assistant | Admitting: Physician Assistant

## 2021-06-21 DIAGNOSIS — R928 Other abnormal and inconclusive findings on diagnostic imaging of breast: Secondary | ICD-10-CM

## 2021-06-21 DIAGNOSIS — D241 Benign neoplasm of right breast: Secondary | ICD-10-CM | POA: Diagnosis not present

## 2021-06-21 HISTORY — PX: BREAST BIOPSY: SHX20

## 2021-06-24 DIAGNOSIS — H26491 Other secondary cataract, right eye: Secondary | ICD-10-CM | POA: Diagnosis not present

## 2021-07-29 ENCOUNTER — Other Ambulatory Visit: Payer: Self-pay | Admitting: Family Medicine

## 2021-09-04 ENCOUNTER — Other Ambulatory Visit (HOSPITAL_COMMUNITY)
Admission: RE | Admit: 2021-09-04 | Discharge: 2021-09-04 | Disposition: A | Discharge status: Home / Self Care | Payer: BC Managed Care – PPO | Source: Ambulatory Visit | Attending: Physician Assistant | Admitting: Physician Assistant

## 2021-09-04 ENCOUNTER — Ambulatory Visit (INDEPENDENT_AMBULATORY_CARE_PROVIDER_SITE_OTHER): Payer: BC Managed Care – PPO | Admitting: Physician Assistant

## 2021-09-04 ENCOUNTER — Encounter: Payer: Self-pay | Admitting: Physician Assistant

## 2021-09-04 VITALS — BP 110/64 | HR 68 | Temp 98.1°F | Ht 60.0 in | Wt 130.0 lb

## 2021-09-04 DIAGNOSIS — E785 Hyperlipidemia, unspecified: Secondary | ICD-10-CM

## 2021-09-04 DIAGNOSIS — Z0000 Encounter for general adult medical examination without abnormal findings: Secondary | ICD-10-CM | POA: Diagnosis not present

## 2021-09-04 DIAGNOSIS — Z124 Encounter for screening for malignant neoplasm of cervix: Secondary | ICD-10-CM

## 2021-09-04 DIAGNOSIS — M79671 Pain in right foot: Secondary | ICD-10-CM

## 2021-09-04 LAB — CBC WITH DIFFERENTIAL/PLATELET
Basophils Absolute: 0.1 10*3/uL (ref 0.0–0.1)
Basophils Relative: 1.2 % (ref 0.0–3.0)
Eosinophils Absolute: 0.1 10*3/uL (ref 0.0–0.7)
Eosinophils Relative: 1.8 % (ref 0.0–5.0)
HCT: 36.3 % (ref 36.0–46.0)
Hemoglobin: 11.7 g/dL — ABNORMAL LOW (ref 12.0–15.0)
Lymphocytes Relative: 34.4 % (ref 12.0–46.0)
Lymphs Abs: 1.6 10*3/uL (ref 0.7–4.0)
MCHC: 32.3 g/dL (ref 30.0–36.0)
MCV: 83.8 fl (ref 78.0–100.0)
Monocytes Absolute: 0.5 10*3/uL (ref 0.1–1.0)
Monocytes Relative: 11.9 % (ref 3.0–12.0)
Neutro Abs: 2.3 10*3/uL (ref 1.4–7.7)
Neutrophils Relative %: 50.7 % (ref 43.0–77.0)
Platelets: 253 10*3/uL (ref 150.0–400.0)
RBC: 4.34 Mil/uL (ref 3.87–5.11)
RDW: 16.2 % — ABNORMAL HIGH (ref 11.5–15.5)
WBC: 4.6 10*3/uL (ref 4.0–10.5)

## 2021-09-04 LAB — COMPREHENSIVE METABOLIC PANEL
ALT: 14 U/L (ref 0–35)
AST: 25 U/L (ref 0–37)
Albumin: 4.3 g/dL (ref 3.5–5.2)
Alkaline Phosphatase: 56 U/L (ref 39–117)
BUN: 15 mg/dL (ref 6–23)
CO2: 29 mEq/L (ref 19–32)
Calcium: 9.2 mg/dL (ref 8.4–10.5)
Chloride: 107 mEq/L (ref 96–112)
Creatinine, Ser: 0.81 mg/dL (ref 0.40–1.20)
GFR: 76.62 mL/min (ref >60.00)
Glucose, Bld: 88 mg/dL (ref 70–99)
Potassium: 3.8 mEq/L (ref 3.5–5.1)
Sodium: 142 mEq/L (ref 135–145)
Total Bilirubin: 0.5 mg/dL (ref 0.2–1.2)
Total Protein: 7 g/dL (ref 6.0–8.3)

## 2021-09-04 LAB — LIPID PANEL
Cholesterol: 176 mg/dL (ref 0–200)
HDL: 92.3 mg/dL (ref >39.00)
LDL Cholesterol: 73 mg/dL (ref 0–99)
NonHDL: 84.12
Total CHOL/HDL Ratio: 2
Triglycerides: 57 mg/dL (ref 0.0–149.0)
VLDL: 11.4 mg/dL (ref 0.0–40.0)

## 2021-09-04 NOTE — Progress Notes (Signed)
Subjective:    Robin Stevens is a 66 y.o. female and is here for a comprehensive physical exam.   HPI  Health Maintenance Due  Topic Date Due   DEXA SCAN  Never done   PAP SMEAR-Modifier  08/17/2020    Acute Concerns: Foot Pain  Patient complain of right foot pain that has been onset for past few months. Comes and goes. Located on top of her foot. States she tripped over grandson in February and  landed on her both knees. However, she does not feel like this pain is related to this fall. Pain is worse at night and will sometimes awaken her. Worse with certain motions. Worse with walking sometimes. She has not tried any specific treatment for this. At this time, she thinks her pain is manageable. Denies any other injury or fall. She would let us know if her symptoms started to get worse. Denies any swelling or calf pain. No numbness or tingling.   Chronic Issues: HLD Patient is currently taking Pravastatin 40 mg daily with no complications. She is tolerating well without any side effects. At this time, she is managing well.   Health Maintenance: Immunizations -- UTD Colonoscopy -- UTD Mammogram -- UTD--- Patient states that following the screening mammogram on 05/15/2021 which showed possible right breast asymmetry. States she was recommended ultrasound at that time. This showed 0.4 cm upper right breast mass and she was recommended. Reports she underwent biopsy on 06/21/2021 and has been doing well since then. She will be following up in 6 months for further evaluation.  PAP -- UTD, 09/04/2021 Bone Density -- Overdue-deferred at this time. She would like to wait until age 46. Diet -- Balanced -mediterranean diet Sleep habits -- No concern  Exercise -- Walks regularly for 30 minutes and yoga.  Weight -- 130 Ib (59 kg) Mood -- Better  Weight history: Wt Readings from Last 10 Encounters:  09/04/21 130 lb (59 kg)  04/16/21 134 lb 9.6 oz (61.1 kg)  01/03/21 133 lb (60.3 kg)   09/03/20 131 lb 12.8 oz (59.8 kg)  08/24/19 129 lb (58.5 kg)  08/23/18 131 lb 4 oz (59.5 kg)  08/17/17 128 lb 4 oz (58.2 kg)  07/16/17 131 lb (59.4 kg)   Body mass index is 25.39 kg/m. No LMP recorded. Patient is postmenopausal. Alcohol use:  reports current alcohol use. Tobacco use: None Tobacco Use: Low Risk  (09/04/2021)   Patient History    Smoking Tobacco Use: Never    Smokeless Tobacco Use: Never    Passive Exposure: Not on file        09/04/2021    8:00 AM  Depression screen PHQ 2/9  Decreased Interest 0  Down, Depressed, Hopeless 0  PHQ - 2 Score 0     Other providers/specialists: Patient Care Team: Inda Coke, [place] as PCP - General (Physician Assistant) [place], Antionette Fairy, MD as Consulting Physician (Ophthalmology)    PMHx, SurgHx, SocialHx, Medications, and Allergies were reviewed in the Visit Navigator and updated as appropriate.   Past Medical History:  Diagnosis Date   Cataract    GERD (gastroesophageal reflux disease)    History of chicken pox    Macular degeneration of both eyes 2016     Past Surgical History:  Procedure Laterality Date   HERNIA REPAIR Bilateral 1999   Inguinal    removal of ovarian cyst Right 1995   THROAT SURGERY  2009   Zinkers Diverticulum   VAGINAL DELIVERY  1985,  1, 1989     Family History  Problem Relation Age of Onset   Arthritis Mother    Heart attack Mother    Heart disease Mother    Pancreatic cancer Father    Early death Maternal Grandmother 52       cardiac-related   Heart disease Maternal Grandfather     Social History   Tobacco Use   Smoking status: Never   Smokeless tobacco: Never  Vaping Use   Vaping Use: Never used  Substance Use Topics   Alcohol use: Yes    Comment: socially   Drug use: Never    Review of Systems:   Review of Systems  Constitutional:  Negative for chills, fever, malaise/fatigue and weight loss.  HENT:  Negative for hearing loss, sinus pain and sore throat.    Respiratory:  Negative for cough and hemoptysis.   Cardiovascular:  Negative for chest pain, palpitations, leg swelling and PND.  Gastrointestinal:  Negative for abdominal pain, constipation, diarrhea, heartburn, nausea and vomiting.  Genitourinary:  Negative for dysuria, frequency and urgency.  Musculoskeletal:  Positive for joint pain. Negative for back pain, myalgias and neck pain.  Skin:  Negative for itching and rash.  Neurological:  Negative for dizziness, tingling, seizures and headaches.  Endo/Heme/Allergies:  Negative for polydipsia.  Psychiatric/Behavioral:  Negative for depression. The patient is not nervous/anxious.      Objective:   BP 110/64 (BP Location: Left Arm, Patient Position: Sitting, Cuff Size: Normal)   Pulse 68   Temp 98.1 F (36.7 C) (Temporal)   Ht 5' (1.524 m)   Wt 130 lb (59 kg)   SpO2 98%   BMI 25.39 kg/m  Body mass index is 25.39 kg/m.   General Appearance:    Alert, cooperative, no distress, appears stated age  Head:    Normocephalic, without obvious abnormality, atraumatic  Eyes:    PERRL, conjunctiva/corneas clear, EOM's intact, fundi    benign, both eyes  Ears:    Normal TM's and external ear canals, both ears  Nose:   Nares normal, septum midline, mucosa normal, no drainage    or sinus tenderness  Throat:   Lips, mucosa, and tongue normal; teeth and gums normal  Neck:   Supple, symmetrical, trachea midline, no adenopathy;    thyroid:  no enlargement/tenderness/nodules; no carotid   bruit or JVD  Back:     Symmetric, no curvature, ROM normal, no CVA tenderness  Lungs:     Clear to auscultation bilaterally, respirations unlabored  Chest Wall:    No tenderness or deformity   Heart:    Regular rate and rhythm, S1 and S2 normal, no murmur, rub or gallop  Breast Exam:    Deferred  Abdomen:     Soft, non-tender, bowel sounds active all four quadrants,    no masses, no organomegaly  Genitalia:    Normal female without lesion, discharge or  tenderness  Extremities:   Extremities normal, atraumatic, no cyanosis or edema  Pulses:   2+ and symmetric all extremities  Skin:   Skin color, texture, turgor normal, no rashes or lesions  Lymph nodes:   Cervical, supraclavicular, and axillary nodes normal  Neurologic:   CNII-XII intact, normal strength, sensation and reflexes    throughout    Assessment/Plan:   Routine physical examination Today patient counseled on age appropriate routine health concerns for screening and prevention, each reviewed and up to date or declined. Immunizations reviewed and up to date or declined. Labs ordered and  reviewed. Risk factors for depression reviewed and negative. Hearing function and visual acuity are intact. ADLs screened and addressed as needed. Functional ability and level of safety reviewed and appropriate. Education, counseling and referrals performed based on assessed risks today. Patient provided with a copy of personalized plan for preventive services.  Dyslipidemia Update lipid panel and adjust pravastatin 40 mg daily  Pap smear for cervical cancer screening Completed today  Right foot pain No red flags Unclear etiology She declines further work-up and will keep [place] posted if this worsens  Patient Counseling: '[x]'$    Nutrition: Stressed importance of moderation in sodium/caffeine intake, saturated fat and cholesterol, caloric balance, sufficient intake of fresh fruits, vegetables, fiber, calcium, iron, and 1 mg of folate supplement per day (for females capable of pregnancy).  '[x]'$    Stressed the importance of regular exercise.   '[x]'$    Substance Abuse: Discussed cessation/primary prevention of tobacco, alcohol, or other drug use; driving or other dangerous activities under the influence; availability of treatment for abuse.   '[x]'$    Injury prevention: Discussed safety belts, safety helmets, smoke detector, smoking near bedding or upholstery.   '[x]'$    Sexuality: Discussed sexually transmitted  diseases, partner selection, use of condoms, avoidance of unintended pregnancy  and contraceptive alternatives.  '[x]'$    Dental health: Discussed importance of regular tooth brushing, flossing, and dental visits.  '[x]'$    Health maintenance and immunizations reviewed. Please refer to Health maintenance section.    I,Savera Zaman,acting as a Education administrator for [employer], PA.,have documented all relevant documentation on the behalf of Inda Coke, PA,as directed by  Inda Coke, PA while in the presence of Inda Coke, [place].   I, Inda Coke, [place], have reviewed all documentation for this visit. The documentation on 09/04/21 for the exam, diagnosis, procedures, and orders are all accurate and complete.   Inda Coke, PA-C [place]

## 2021-09-04 NOTE — Patient Instructions (Signed)
It was great to see you! ? ?Please go to the lab for blood work.  ? ?Our office will call you with your results unless you have chosen to receive results via MyChart. ? ?If your blood work is normal we will follow-up each year for physicals and as scheduled for chronic medical problems. ? ?If anything is abnormal we will treat accordingly and get you in for a follow-up. ? ?Take care, ? ?Robin Stevens ?  ? ? ?

## 2021-09-05 LAB — CYTOLOGY - PAP
Comment: NEGATIVE
Diagnosis: NEGATIVE
High risk HPV: NEGATIVE

## 2021-11-05 ENCOUNTER — Other Ambulatory Visit: Payer: Self-pay | Admitting: Physician Assistant

## 2021-11-05 DIAGNOSIS — R928 Other abnormal and inconclusive findings on diagnostic imaging of breast: Secondary | ICD-10-CM

## 2021-11-25 ENCOUNTER — Encounter: Payer: Self-pay | Admitting: Physician Assistant

## 2021-11-25 MED ORDER — PRAVASTATIN SODIUM 40 MG PO TABS: 40.0000 mg | ORAL_TABLET | Freq: Every day | ORAL | 1 refills | Status: DC

## 2021-11-29 DIAGNOSIS — H26492 Other secondary cataract, left eye: Secondary | ICD-10-CM | POA: Diagnosis not present

## 2021-12-02 ENCOUNTER — Encounter: Payer: Self-pay | Admitting: *Deleted

## 2021-12-04 DIAGNOSIS — H353132 Nonexudative age-related macular degeneration, bilateral, intermediate dry stage: Secondary | ICD-10-CM | POA: Diagnosis not present

## 2021-12-23 ENCOUNTER — Other Ambulatory Visit: Payer: Self-pay | Admitting: Physician Assistant

## 2021-12-23 ENCOUNTER — Ambulatory Visit
Admission: RE | Admit: 2021-12-23 | Discharge: 2021-12-23 | Disposition: A | Discharge status: Home / Self Care | Payer: BC Managed Care – PPO | Source: Ambulatory Visit | Attending: Physician Assistant | Admitting: Physician Assistant

## 2021-12-23 DIAGNOSIS — N6489 Other specified disorders of breast: Secondary | ICD-10-CM | POA: Diagnosis not present

## 2021-12-23 DIAGNOSIS — R928 Other abnormal and inconclusive findings on diagnostic imaging of breast: Secondary | ICD-10-CM

## 2022-05-26 ENCOUNTER — Encounter: Payer: Self-pay | Admitting: Physician Assistant

## 2022-05-26 NOTE — Telephone Encounter (Signed)
Okay to order Dexa scan prior to physical in July?

## 2022-05-27 ENCOUNTER — Ambulatory Visit
Admission: RE | Admit: 2022-05-27 | Discharge: 2022-05-27 | Disposition: A | Discharge status: Home / Self Care | Payer: BC Managed Care – PPO | Source: Ambulatory Visit | Attending: Physician Assistant | Admitting: Physician Assistant

## 2022-05-27 DIAGNOSIS — R928 Other abnormal and inconclusive findings on diagnostic imaging of breast: Secondary | ICD-10-CM | POA: Diagnosis not present

## 2022-06-29 IMAGING — CT CT CARDIAC CORONARY ARTERY CALCIUM SCORE
3 series · 14 of 20 positions shown, 16 images · non-contrast
Comparison: No priors.
COMPARISON: No priors.

Addendum:
EXAM:
OVER-READ INTERPRETATION  CT CHEST

The following report is an over-read performed by radiologist Dr.
Lkw Tiger [REDACTED] on 06/04/2021. This
over-read does not include interpretation of cardiac or coronary
anatomy or pathology. The coronary calcium score interpretation by
the cardiologist is attached.
CLINICAL DATA: Cardiovascular Disease Risk stratification
Coronary Calcium Score
TECHNIQUE: A gated, non-contrast computed tomography scan of the heart was
performed using 3mm slice thickness. Axial images were analyzed on a
dedicated workstation. Calcium scoring of the coronary arteries was
performed using the Agatston method.

[Series 2: cascseq 2.0 sa36 70% (id) · axial · 0.39mm/px · z∈[-246,-166]mm · 4 of 68 slices shown]
[im 14/68  vessel]
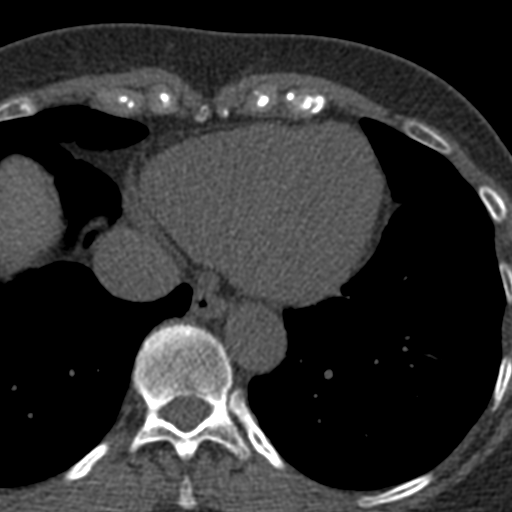
[im 27/68  vessel]
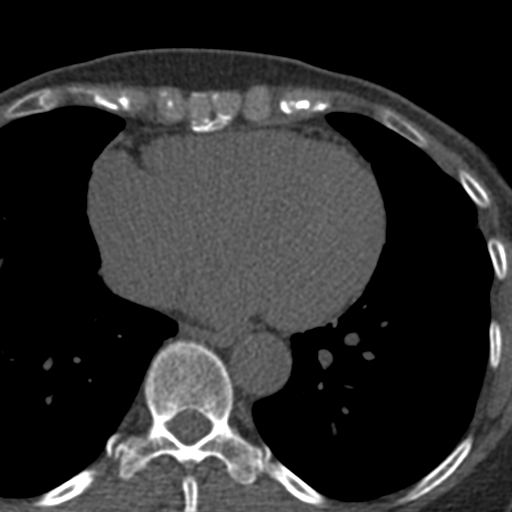
[im 41/68  vessel]
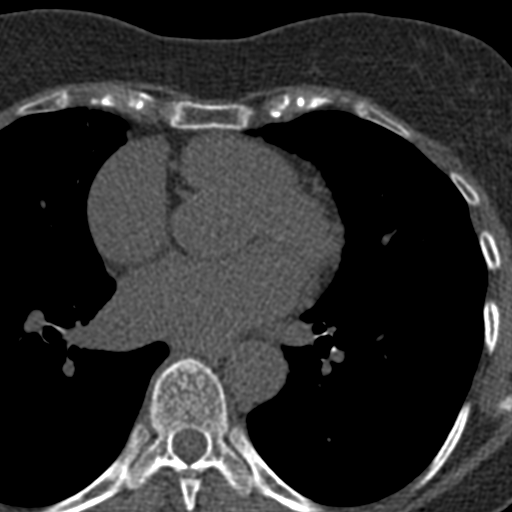
[im 54/68  vessel]
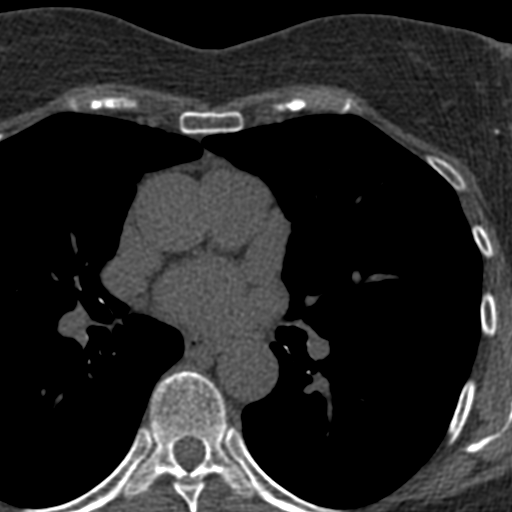

[Series 3: cascseq 2.0 bf37 st · axial · 0.64mm/px · z∈[-250,-162]mm · 5 of 68 slices shown, 7 images]
[im 12/68  vessel]
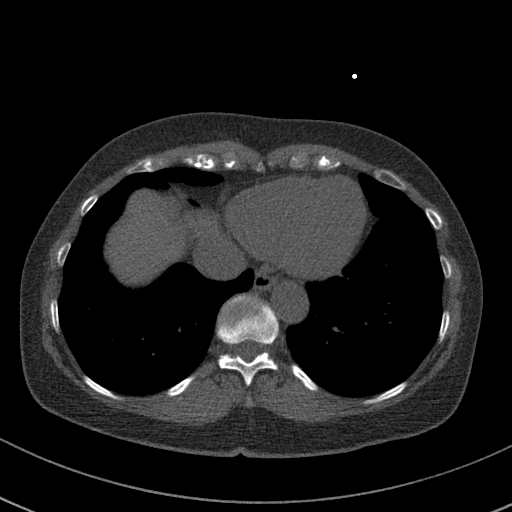
[im 12/68  lung]
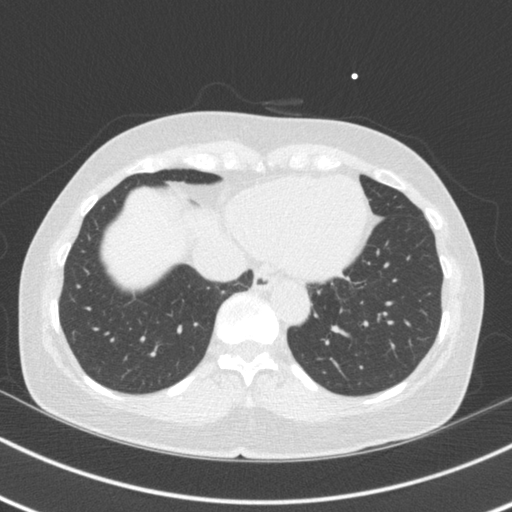
[im 23/68  vessel]
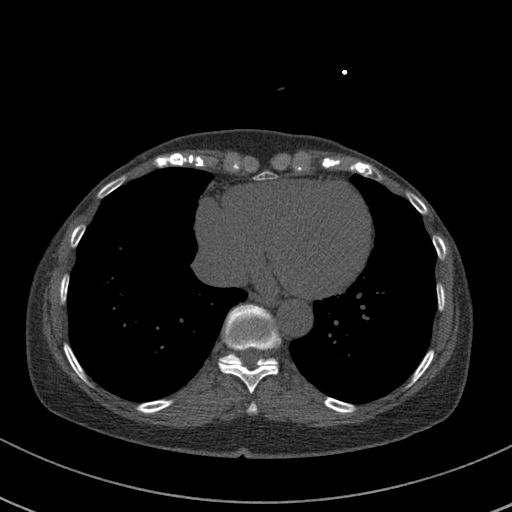
[im 34/68  vessel]
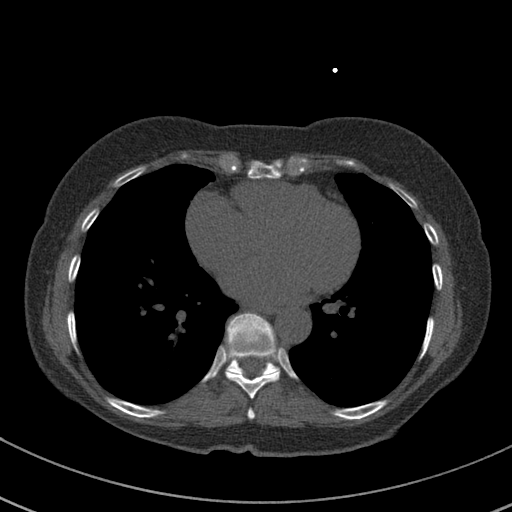
[im 45/68  vessel]
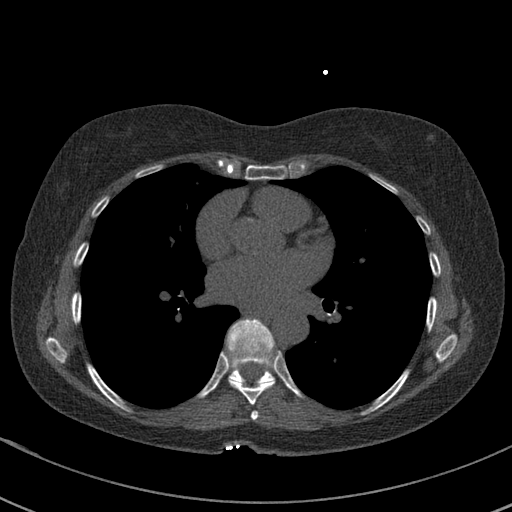
[im 56/68  vessel]
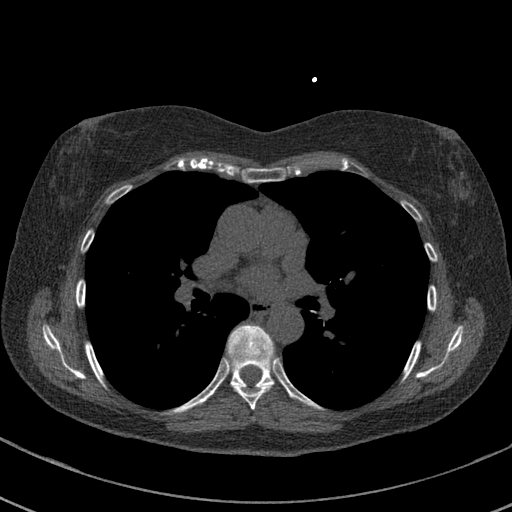
[im 56/68  lung]
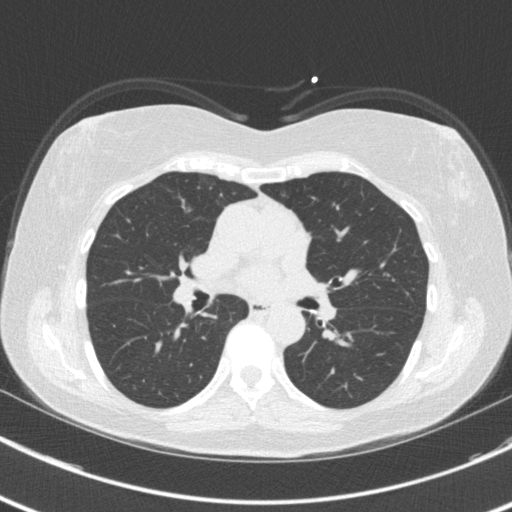

[Series 4: cascseq 2.0 br59 lung · axial · 0.64mm/px · z∈[-250,-162]mm · 5 of 68 slices shown]
[im 12/68  lung]
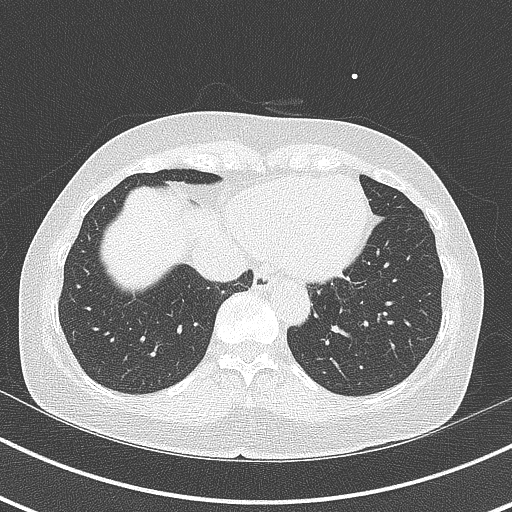
[im 23/68  lung]
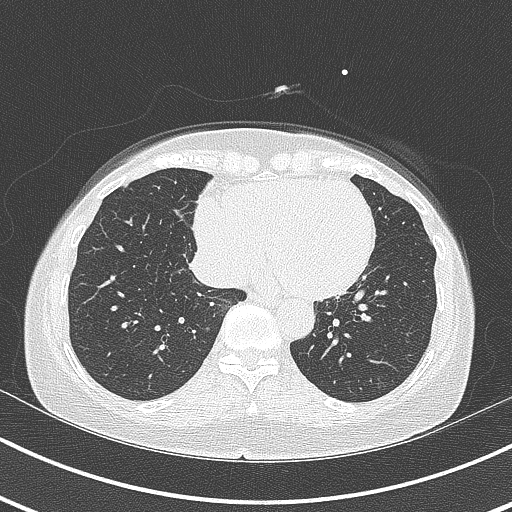
[im 34/68  lung]
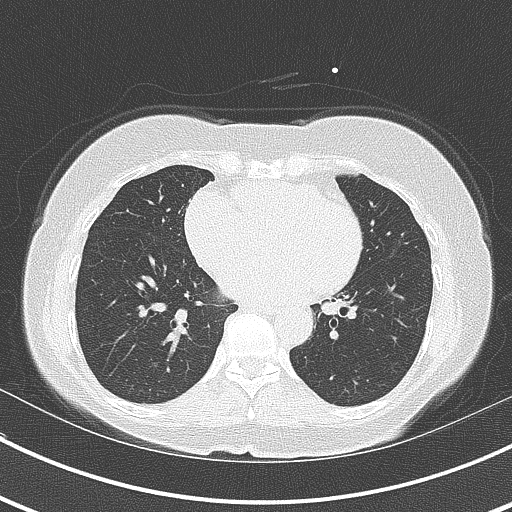
[im 45/68  lung]
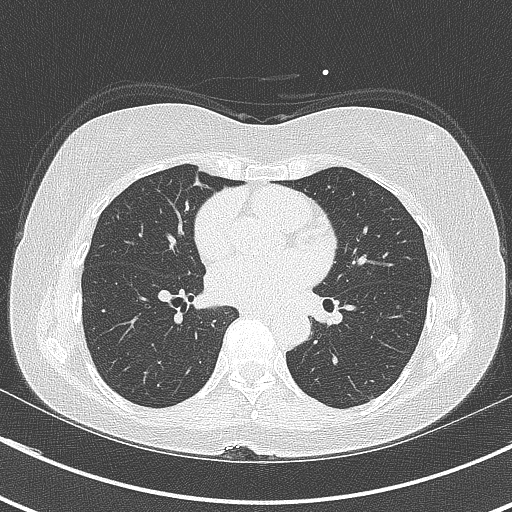
[im 56/68  lung]
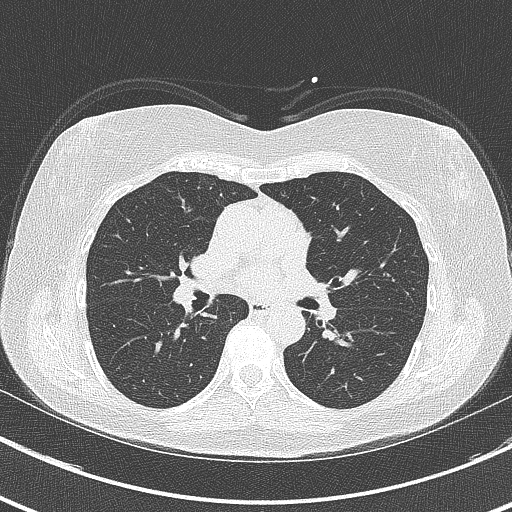

[14 of 20 positions shown; findings below may reference images not displayed]

FINDINGS: Atherosclerotic calcifications in the thoracic aorta. Within the
visualized portions of the thorax there are no suspicious appearing
pulmonary nodules or masses, there is no acute consolidative
airspace disease, no pleural effusions, no pneumothorax and no
lymphadenopathy. Visualized portions of the upper abdomen are
unremarkable. There are no aggressive appearing lytic or blastic
lesions noted in the visualized portions of the skeleton. Small
right-sided Bochdalek's hernia incidentally noted.
IMPRESSION: 1.  Aortic Atherosclerosis (7JOCC-S4R.R).
FINDINGS: Coronary arteries: Normal origins.

Coronary Calcium Score:

Left main: 0

Left anterior descending artery: 0

Left circumflex artery: 0

Right coronary artery: 0

Total: 0

Percentile: 0

Pericardium: Normal.

Ascending Aorta: Normal caliber.  Scattered calcifications.

Non-cardiac: See separate report from [REDACTED].
IMPRESSION: Coronary calcium score of 0. This was 0 percentile for age-, race-,
and sex-matched controls.



If CAC=0, it is reasonable to withhold statin therapy and reassess
in 5 to 10 years, as long as higher risk conditions are absent
(diabetes mellitus, family history of premature CHD in first degree
relatives (males <55 years; females <65 years), cigarette smoking,
or LDL >=190 mg/dL).

If CAC is 1 to 99, it is reasonable to initiate statin therapy for
patients >=55 years of age.

If CAC is >=100 or >=75th percentile, it is reasonable to initiate
statin therapy at any age.

Cardiology referral should be considered for patients with CAC
scores >=400 or >=75th percentile.

*9213 AHA/ACC/AACVPR/AAPA/ABC/RUDI/NGUIE/NAJY/[place]/BIJOUX/HIRAYAMA/MONJE
Guideline on the Management of Blood Cholesterol: A Report of the
American College of Cardiology/American Heart Association Task Force
on Clinical Practice Guidelines. J Am Coll Cardiol.
7979;73(24):7182-7007.

*** End of Addendum ***
EXAM:
OVER-READ INTERPRETATION  CT CHEST

The following report is an over-read performed by radiologist Dr.
Lkw Tiger [REDACTED] on 06/04/2021. This
over-read does not include interpretation of cardiac or coronary
anatomy or pathology. The coronary calcium score interpretation by
the cardiologist is attached.
FINDINGS: Atherosclerotic calcifications in the thoracic aorta. Within the
visualized portions of the thorax there are no suspicious appearing
pulmonary nodules or masses, there is no acute consolidative
airspace disease, no pleural effusions, no pneumothorax and no
lymphadenopathy. Visualized portions of the upper abdomen are
unremarkable. There are no aggressive appearing lytic or blastic
lesions noted in the visualized portions of the skeleton. Small
right-sided Bochdalek's hernia incidentally noted.
IMPRESSION: 1.  Aortic Atherosclerosis (7JOCC-S4R.R).

## 2022-07-02 IMAGING — US US BREAST*R* LIMITED INC AXILLA
1 series · 4 of 4 positions shown · non-contrast
Comparison: Previous exam(s).

CLINICAL DATA: 65-year-old female for further evaluation of
possible RIGHT breast asymmetry on screening mammogram.

EXAM:
DIGITAL DIAGNOSTIC UNILATERAL RIGHT MAMMOGRAM WITH TOMOSYNTHESIS AND
CAD; ULTRASOUND RIGHT BREAST LIMITED
TECHNIQUE: Right digital diagnostic mammography and breast tomosynthesis was
performed. The images were evaluated with computer-aided detection.;
Targeted ultrasound examination of the right breast was performed

[Series 1: us breast*right* limited inc axilla · 0.07mm/px · 4 of 4 slices shown]
[im 1/4]
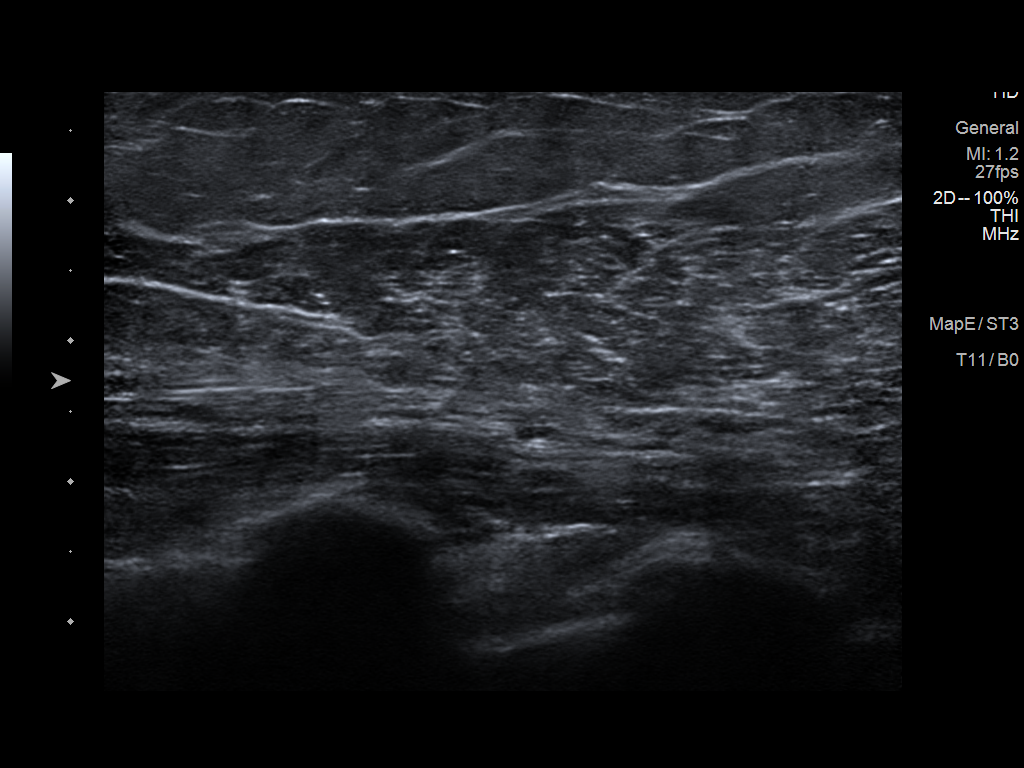
[im 2/4]
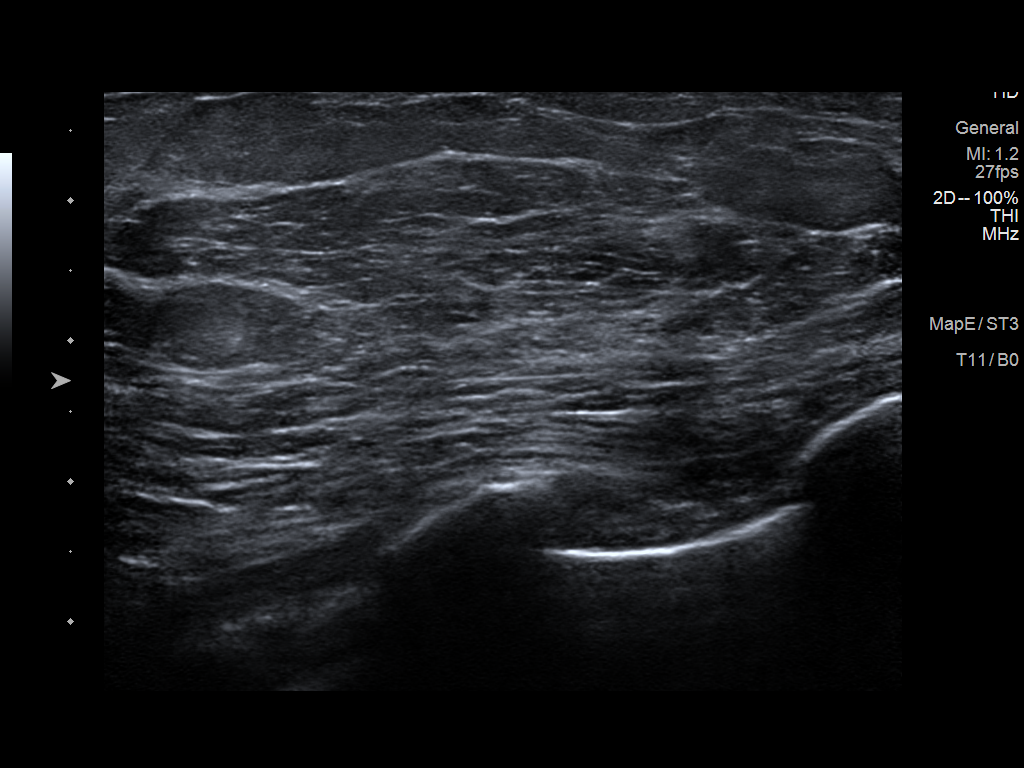
[im 3/4]
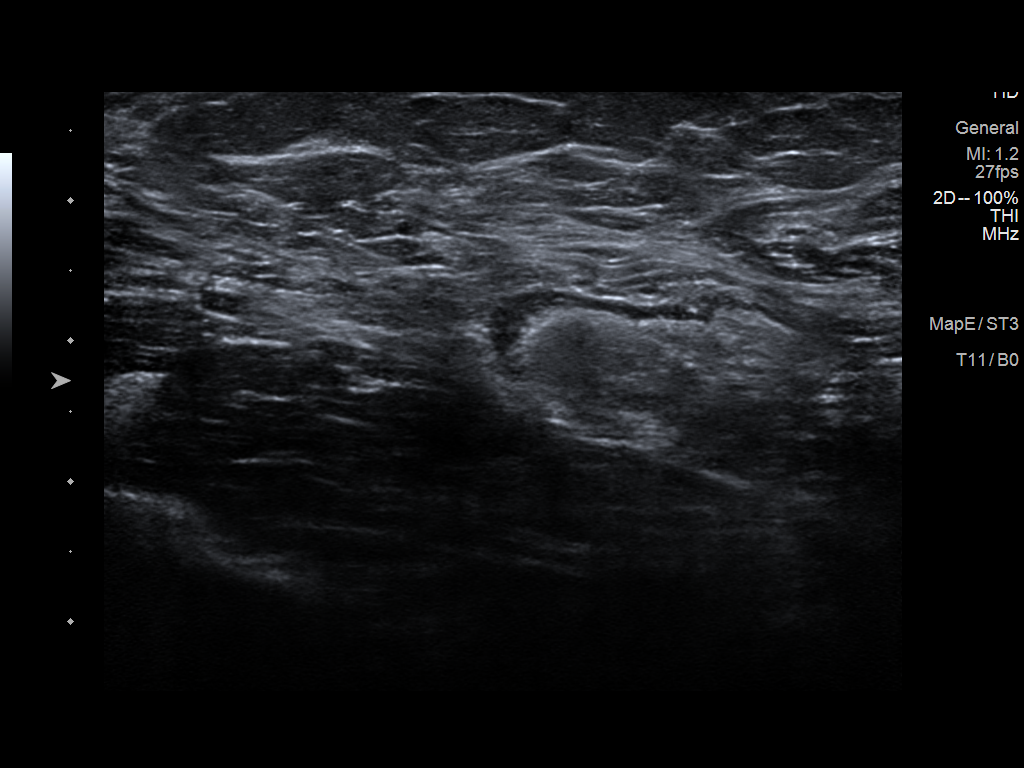
[im 4/4]
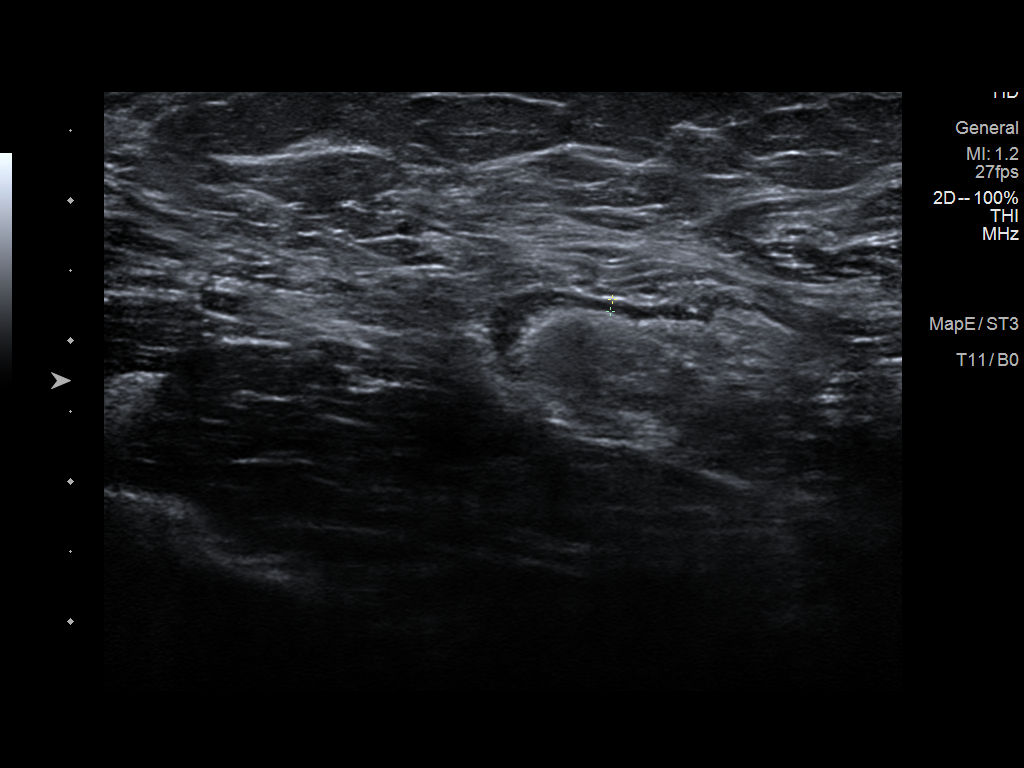

[4 of 4 positions shown; findings below may reference images not displayed]

ACR Breast Density Category b: There are scattered areas of
fibroglandular density.
FINDINGS: Spot compression views of the RIGHT breast are performed.

The 0.3 cm RIGHT breast screening study asymmetry identified on the
CC view centrally partially effaces.

On the MLO view however, a 0.4 cm slightly irregular mass within the
UPPER RIGHT breast, middle to posterior depth, is identified, and is
likely a different finding from the CC view abnormality.

Targeted ultrasound is performed, showing no sonographic
abnormalities within the central or UPPER RIGHT breast.

No abnormal RIGHT axillary lymph nodes are noted.
IMPRESSION: 1. Indeterminate 0.4 cm UPPER RIGHT breast mass identified on the
MLO view, and without sonographic correlate. Tissue sampling is
recommended. This is not likely to represent the CC screening study
finding but correlation on post biopsy clip films recommended.
2. Improved appearance of the 0.3 cm RIGHT breast screening study
asymmetry identified on the CC view centrally, and without
sonographic correlate, likely benign. If the above biopsy is a high
risk lesion or malignancy, than recommend either stereotactic biopsy
of this area or MRI.
3. No abnormal appearing RIGHT axillary lymph nodes.

RECOMMENDATION:
1. 3D/stereotactic biopsy of 0.4 cm UPPER RIGHT breast mass, which
has been scheduled for 06/21/2021.
2. RIGHT diagnostic mammogram in 6 months to follow-up likely benign
CC study asymmetry. However if biopsy of the UPPER RIGHT breast mass
represents a high risk lesion or malignancy then recommend either
stereotactic biopsy of this area or MRI.

I have discussed the findings and recommendations with the patient.
If applicable, a reminder letter will be sent to the patient
regarding the next appointment.

BI-RADS CATEGORY  4: Suspicious.

## 2022-07-05 ENCOUNTER — Other Ambulatory Visit: Payer: Self-pay | Admitting: Physician Assistant

## 2022-07-08 DIAGNOSIS — H353131 Nonexudative age-related macular degeneration, bilateral, early dry stage: Secondary | ICD-10-CM | POA: Diagnosis not present

## 2022-09-09 ENCOUNTER — Ambulatory Visit (INDEPENDENT_AMBULATORY_CARE_PROVIDER_SITE_OTHER): Payer: BC Managed Care – PPO | Admitting: Physician Assistant

## 2022-09-09 ENCOUNTER — Encounter: Payer: Self-pay | Admitting: Physician Assistant

## 2022-09-09 VITALS — BP 110/70 | HR 60 | Temp 97.1°F | Ht 60.0 in | Wt 128.0 lb

## 2022-09-09 DIAGNOSIS — E785 Hyperlipidemia, unspecified: Secondary | ICD-10-CM | POA: Diagnosis not present

## 2022-09-09 DIAGNOSIS — E2839 Other primary ovarian failure: Secondary | ICD-10-CM | POA: Diagnosis not present

## 2022-09-09 DIAGNOSIS — Z23 Encounter for immunization: Secondary | ICD-10-CM | POA: Diagnosis not present

## 2022-09-09 DIAGNOSIS — Z0001 Encounter for general adult medical examination with abnormal findings: Secondary | ICD-10-CM | POA: Diagnosis not present

## 2022-09-09 DIAGNOSIS — Z0000 Encounter for general adult medical examination without abnormal findings: Secondary | ICD-10-CM

## 2022-09-09 DIAGNOSIS — M25542 Pain in joints of left hand: Secondary | ICD-10-CM | POA: Diagnosis not present

## 2022-09-09 LAB — CBC WITH DIFFERENTIAL/PLATELET
Basophils Absolute: 0 10*3/uL (ref 0.0–0.1)
Basophils Relative: 0.6 % (ref 0.0–3.0)
Eosinophils Absolute: 0.1 10*3/uL (ref 0.0–0.7)
Eosinophils Relative: 2 % (ref 0.0–5.0)
HCT: 37.1 % (ref 36.0–46.0)
Hemoglobin: 11.7 g/dL — ABNORMAL LOW (ref 12.0–15.0)
Lymphocytes Relative: 25.9 % (ref 12.0–46.0)
Lymphs Abs: 1.8 10*3/uL (ref 0.7–4.0)
MCHC: 31.5 g/dL (ref 30.0–36.0)
MCV: 88.3 fl (ref 78.0–100.0)
Monocytes Absolute: 0.6 10*3/uL (ref 0.1–1.0)
Monocytes Relative: 8 % (ref 3.0–12.0)
Neutro Abs: 4.4 10*3/uL (ref 1.4–7.7)
Neutrophils Relative %: 63.5 % (ref 43.0–77.0)
Platelets: 256 10*3/uL (ref 150.0–400.0)
RBC: 4.2 Mil/uL (ref 3.87–5.11)
RDW: 16 % — ABNORMAL HIGH (ref 11.5–15.5)
WBC: 6.9 10*3/uL (ref 4.0–10.5)

## 2022-09-09 LAB — LIPID PANEL
Cholesterol: 167 mg/dL (ref 0–200)
HDL: 81.5 mg/dL (ref >39.00)
LDL Cholesterol: 72 mg/dL (ref 0–99)
NonHDL: 85
Total CHOL/HDL Ratio: 2
Triglycerides: 66 mg/dL (ref 0.0–149.0)
VLDL: 13.2 mg/dL (ref 0.0–40.0)

## 2022-09-09 LAB — COMPREHENSIVE METABOLIC PANEL
ALT: 13 U/L (ref 0–35)
AST: 24 U/L (ref 0–37)
Albumin: 4.3 g/dL (ref 3.5–5.2)
Alkaline Phosphatase: 60 U/L (ref 39–117)
BUN: 12 mg/dL (ref 6–23)
CO2: 29 mEq/L (ref 19–32)
Calcium: 9.7 mg/dL (ref 8.4–10.5)
Chloride: 107 mEq/L (ref 96–112)
Creatinine, Ser: 0.77 mg/dL (ref 0.40–1.20)
GFR: 80.85 mL/min (ref >60.00)
Glucose, Bld: 91 mg/dL (ref 70–99)
Potassium: 5.4 mEq/L — ABNORMAL HIGH (ref 3.5–5.1)
Sodium: 144 mEq/L (ref 135–145)
Total Bilirubin: 0.6 mg/dL (ref 0.2–1.2)
Total Protein: 6.8 g/dL (ref 6.0–8.3)
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
## 2022-09-09 NOTE — Patient Instructions (Addendum)
It was great to see you!  Please schedule a Bone Density test with the front desk on your way out of the office today.  A referral has been placed for you to see one of our fantastic providers at [facility]. Someone from their office will be in touch soon regarding scheduling your appointment.  Their location:   Sports Medicine at [facility]  [address] on the 1st floor Phone number [phone] Fax [phone].   This location is across the street from the entrance to [facility] and in the same complex as the [facility]   Please go to the lab for blood work.   Our office will call you with your results unless you have chosen to receive results via MyChart.  If your blood work is normal we will follow-up each year for physicals and as scheduled for chronic medical problems.  If anything is abnormal we will treat accordingly and get you in for a follow-up.  Take care,  Lelon Mast

## 2022-09-10 ENCOUNTER — Other Ambulatory Visit: Payer: Self-pay | Admitting: Physician Assistant

## 2022-09-10 DIAGNOSIS — E875 Hyperkalemia: Secondary | ICD-10-CM
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
## 2022-09-18 ENCOUNTER — Encounter: Payer: Self-pay | Admitting: Family Medicine

## 2022-09-18 ENCOUNTER — Ambulatory Visit (INDEPENDENT_AMBULATORY_CARE_PROVIDER_SITE_OTHER): Payer: BC Managed Care – PPO

## 2022-09-18 ENCOUNTER — Ambulatory Visit (INDEPENDENT_AMBULATORY_CARE_PROVIDER_SITE_OTHER): Payer: BC Managed Care – PPO | Admitting: Family Medicine

## 2022-09-18 ENCOUNTER — Other Ambulatory Visit: Payer: Self-pay

## 2022-09-18 VITALS — BP 130/82 | HR 78 | Ht 60.0 in | Wt 130.0 lb

## 2022-09-18 DIAGNOSIS — E875 Hyperkalemia: Secondary | ICD-10-CM | POA: Diagnosis not present

## 2022-09-18 DIAGNOSIS — M25561 Pain in right knee: Secondary | ICD-10-CM | POA: Diagnosis not present

## 2022-09-18 DIAGNOSIS — G8929 Other chronic pain: Secondary | ICD-10-CM | POA: Diagnosis not present

## 2022-09-18 DIAGNOSIS — M79645 Pain in left finger(s): Secondary | ICD-10-CM | POA: Diagnosis not present

## 2022-09-18 DIAGNOSIS — M79642 Pain in left hand: Secondary | ICD-10-CM | POA: Diagnosis not present

## 2022-09-18 DIAGNOSIS — M1812 Unilateral primary osteoarthritis of first carpometacarpal joint, left hand: Secondary | ICD-10-CM | POA: Diagnosis not present

## 2022-09-18 DIAGNOSIS — E559 Vitamin D deficiency, unspecified: Secondary | ICD-10-CM | POA: Diagnosis not present

## 2022-09-18 LAB — BASIC METABOLIC PANEL
BUN: 20 mg/dL (ref 6–23)
CO2: 29 mEq/L (ref 19–32)
Calcium: 9.8 mg/dL (ref 8.4–10.5)
Chloride: 103 mEq/L (ref 96–112)
Creatinine, Ser: 0.87 mg/dL (ref 0.40–1.20)
GFR: 69.82 mL/min (ref >60.00)
Glucose, Bld: 79 mg/dL (ref 70–99)
Potassium: 4.3 mEq/L (ref 3.5–5.1)
Sodium: 139 mEq/L (ref 135–145)

## 2022-09-18 NOTE — Patient Instructions (Addendum)
Thank you for coming in today.   Please get an Xray today before you leave   You received an injection today. Seek immediate medical attention if the joint becomes red, extremely painful, or is oozing fluid.   I've referred you to Physical Therapy.  Let us know if you don't hear from them in one week.   Check back as needed

## 2022-09-19 LAB — VITAMIN D 25 HYDROXY (VIT D DEFICIENCY, FRACTURES): Vit D, 25-Hydroxy: 51 ng/mL (ref 30–100)

## 2022-09-19 NOTE — Progress Notes (Signed)
Vit D looks great

## 2022-09-23 NOTE — Progress Notes (Signed)
Left hand x-ray shows some arthritis at the thumb.

## 2022-09-23 NOTE — Progress Notes (Signed)
Right knee x-ray looks okay to radiology.  No severe arthritis or fractures are present.

## 2022-09-29 ENCOUNTER — Encounter: Payer: Self-pay | Admitting: Family Medicine

## 2022-09-29 DIAGNOSIS — M25561 Pain in right knee: Secondary | ICD-10-CM

## 2022-10-02 NOTE — Therapy (Signed)
OUTPATIENT OCCUPATIONAL THERAPY ORTHO EVALUATION  Patient Name: Robin Stevens MRN: [id number] DOB:[birth year], 67 y.o., female Today's Date: 10/03/2022  PCP: Jarold Motto, PA REFERRING PROVIDER: Rodolph Bong, MD   END OF SESSION:  OT End of Session - 10/03/22 0759     Visit Number 1    Number of Visits 6    Date for OT Re-Evaluation 11/14/22    Authorization Type BCBS    OT Start Time 0800    OT Stop Time 0856    OT Time Calculation (min) 56 min    Activity Tolerance Patient tolerated treatment well;No increased pain;Patient limited by pain    Behavior During Therapy [facility] for tasks assessed/performed             Past Medical History:  Diagnosis Date   Allergy 2017   Seasonal started after relocate to [place]   Arthritis    would like to confirm if I have arthritis   Cataract    Depression 2008-09   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   GERD (gastroesophageal reflux disease)    History of chicken pox    Macular degeneration of both eyes 2016   Past Surgical History:  Procedure Laterality Date   EYE SURGERY  March 2022   Cataract both eyes   HERNIA REPAIR Bilateral 1999   Inguinal    removal of ovarian cyst Right 1995   THROAT SURGERY  2009   Zinkers Diverticulum   VAGINAL DELIVERY  1985, 1987, 1989   Patient Active Problem List   Diagnosis Date Noted   Dyslipidemia 09/03/2020   Environmental and seasonal allergies 08/23/2018   Vitamin D deficiency 08/17/2017   Arthralgia of both hands 08/17/2017   Macular degeneration of both eyes 03/10/2014    ONSET DATE: approx 3 months onset  REFERRING DIAG: M79.645,G89.29 (ICD-10-CM) - Chronic pain of left thumb   THERAPY DIAG:  Pain in left hand  Muscle weakness (generalized)  Other lack of coordination  Rationale for Evaluation and Treatment: Rehabilitation  SUBJECTIVE:   SUBJECTIVE STATEMENT: She states she has developed Lt basal joint thumb pain. She also does yoga, is an  extensive typist at work, enjoys Pharmacist, community and active lifestyle.  She states intermittent tingling in her fingers at times that may be related to sleeping postures.  She states dropping things at times due to pain, difficulty holding and gripping objects with the left hand especially.   PERTINENT HISTORY: Pe rreferral: "Evaluate and Treat for L thumb pain. 1-2 times per week for 4-6 weeks.  Decrease pain, increase strength, flexibility, function, and range of motion.  Modalities may include, traction, ionto, phono, stim, and dry needling prn."  PRECAUTIONS: None  RED FLAGS: None   WEIGHT BEARING RESTRICTIONS: No  PAIN:  Are you having pain? Yes: NPRS scale: 2/10 at rest now, when reaching/gripping up to 10/10 Pain location: Lt basla joint of thumb Pain description: sharp at times, aching most times Aggravating factors: strong gripping Relieving factors: rest, heat   FALLS: Has patient fallen in last 6 months? No  LIVING ENVIRONMENT: Lives with: lives with their spouse Lives in: House/apartment Has following equipment at home: None  PLOF: Independent  PATIENT GOALS: To get strategies and exercises to help reduce pain in her left thumb   OBJECTIVE: (All objective assessments below are from initial evaluation on: 10/03/22 unless otherwise specified.)   HAND DOMINANCE: Right   ADLs: Overall ADLs: States decreased ability to grab, hold household objects, pain and inability to  open containers, perform FMS tasks (manipulate fasteners on clothing), mild to moderate bathing problems as well.    FUNCTIONAL OUTCOME MEASURES: Eval: Quick DASH 34% impairment today  (Higher % Score  =  More Impairment)     UPPER EXTREMITY ROM     Shoulder to Wrist AROM Right eval Left eval  Wrist flexion 63 68  Wrist extension 55 79  (Blank rows = not tested)   Hand AROM Right eval Left eval  Full Fist Ability (or Gap to Distal Palmar Crease)  full  Thumb Opposition  (Kapandji Scale)   10  with pain  Thumb MCP (0-60) 51 45  Thumb IP (0-80) 76 72  Thumb Radial Abduction Span     Thumb Palmar Abduction Span     (Blank rows = not tested)   UPPER EXTREMITY MMT:    Eval: Bilateral wrists were grossly 4/5 MMT or better in flex/ext; Lt thumb was 4-/5 pain in flexion, but 4/5 MMT in all other planes  HAND FUNCTION: Eval: Observed weakness in affected hand.  Grip strength Right: 42 lbs, Left: 29 lbs Tip pinch Rt: 8#; Lt: 3#    COORDINATION: Eval: Mild observed coordination impairments with affected Lt hand. Details TBD   SENSATION: Eval:  Light touch intact today, though diminished around sx area    EDEMA:   Eval: Mild swelling in basal joints of thumbs as well as MCP Js in both hands, mild red as well   COGNITION: Eval: Overall cognitive status: WFL for evaluation today   OBSERVATIONS:   Eval: intact muscles with at least fair strength about the Lt thumb, some obvious collapse deformity starting with hyper ext of MCP J and difficulty abducting the 1st metacarpal (this is not present in Rt thumb). Some complaints of intermittent paresthesia in both hands (worse at night).  She also seems to have a tight thumb adductor pollicis in the left webspace  Presents like a collapse deformity beginning in the left thumb as well as a stiff right wrist and mild, intermittent paresthesias due to nerve compression.   TODAY'S TREATMENT:  Post-evaluation treatment:  For her intermittent nervous complaints, OT makes recommendations for better sleep postures and avoiding nerve compression with posturing.  Then OT gives education to avoid forceful extension of the thumbs as this will contribute to collapse deformity.  OT educates on the following home exercise program and we are able to get through the first 5 exercises today.  She demonstrates some back with some difficulty with proper form and difficulty relaxing, but no added pain and does feel somewhat of stretches.  She is advised to do  2-3 times every day, without any pain, and after warming her hands for about 5 minutes.  She is advised to avoid repetitive activities or stressful gripping when driving and other tasks.  She states understanding directions today  Exercises - Seated Wrist Flexion Stretch  - 3 x daily - 3 reps - 15 hold - Wrist Prayer Stretch  - 3 x daily - 3 reps - 15 sec hold - Stretch Thumb DOWNWARD  - 3 x daily - 3 reps - 15 sec hold - Thumb Webspace Stretch  - 3 x daily - 3 reps - 15 sec hold - Towel Roll Grip with Forearm in Neutral  - 3 x daily - 5 reps - 10 sec hold - Spread Index Finger Apart  - 3 x daily - 5 reps - 5 sec hold - C-Strength (try using rubber band)   -  3 x daily - 5 reps - 5 sec hold    PATIENT EDUCATION: Education details: See tx section above for details  Person educated: Patient Education method: Verbal Instruction, Teach back, Handouts  Education comprehension: States and demonstrates understanding, Additional Education required    HOME EXERCISE PROGRAM: Access Code: [email or web address] URL: [email or web address] Date: 10/03/2022 Prepared by: Fannie Knee   GOALS: Goals reviewed with patient? Yes   SHORT TERM GOALS: (STG required if POC>30 days) Target Date: 10/17/22  Pt will obtain protective, custom orthotic. Goal status: TBD/PRN  2.  Pt will demo/state understanding of initial HEP to improve pain levels and prerequisite motion. Goal status: INITIAL   LONG TERM GOALS: Target Date: 11/14/22  Pt will improve functional ability by decreased impairment per Quick DASH assessment from 34% to 15% or better, for better quality of life. Goal status: INITIAL  2.  Pt will improve grip strength in Lt hand from 29lbs to at least 35lbs for functional use at home and in IADLs. Goal status: INITIAL  3.  Pt will improve A/ROM in Rt wrist flex/ext to at least 65* each, to have functional motion for tasks like reach and grasp.  Goal status: INITIAL  4.  Pt will  decrease pain at worst from 10/10 to 4/10 or better to have better sleep and occupational participation in daily roles. Goal status: INITIAL   ASSESSMENT:  CLINICAL IMPRESSION: Patient is a 67 y.o. female who was seen today for occupational therapy evaluation for left hand and thumb pain, though she is also having some right wrist stiffness and occasional bilateral tingling sensations (nighttime nerve compression).  These things negatively impact her daily life, her ability to pick up and hold objects, do her ADLs and IADLs.  She will benefit from outpatient occupational therapy to increase quality of life.   PERFORMANCE DEFICITS: in functional skills including ADLs, IADLs, coordination, dexterity, ROM, strength, pain, fascial restrictions, muscle spasms, flexibility, Fine motor control, body mechanics, endurance, decreased knowledge of precautions, and UE functional use, cognitive skills including problem solving and safety awareness, and psychosocial skills including coping strategies, environmental adaptation, and habits.   IMPAIRMENTS: are limiting patient from ADLs, IADLs, work, leisure, and social participation.   COMORBIDITIES: may have co-morbidities  that affects occupational performance. Patient will benefit from skilled OT to address above impairments and improve overall function.  MODIFICATION OR ASSISTANCE TO COMPLETE EVALUATION: No modification of tasks or assist necessary to complete an evaluation.  OT OCCUPATIONAL PROFILE AND HISTORY: Problem focused assessment: Including review of records relating to presenting problem.  CLINICAL DECISION MAKING: LOW - limited treatment options, no task modification necessary  REHAB POTENTIAL: Excellent  EVALUATION COMPLEXITY: Low      PLAN:  OT FREQUENCY: 1-2x/week  OT DURATION: 6 weeks as needed/able through 11/14/22 and up to 6 total visits  PLANNED INTERVENTIONS: self care/ADL training, therapeutic exercise, therapeutic activity,  neuromuscular re-education, manual therapy, passive range of motion, splinting, ultrasound, compression bandaging, moist heat, cryotherapy, contrast bath, patient/family education, energy conservation, coping strategies training, DME and/or AE instructions, Re-evaluation, and Dry needling  RECOMMENDED OTHER SERVICES: none now   CONSULTED AND AGREED WITH PLAN OF CARE: Patient  PLAN FOR NEXT SESSION:   Review initial home exercises, and remaining 2 exercises.  Give out a packet for reducing wear and tear and recommendations for thumb OA.   Fannie Knee, OTR/L 10/03/2022, 9:12 AM

## 2022-10-03 ENCOUNTER — Encounter: Payer: Self-pay | Admitting: Rehabilitative and Restorative Service Providers"

## 2022-10-03 ENCOUNTER — Other Ambulatory Visit: Payer: Self-pay

## 2022-10-03 ENCOUNTER — Ambulatory Visit: Payer: BC Managed Care – PPO | Admitting: Rehabilitative and Restorative Service Providers"

## 2022-10-03 DIAGNOSIS — R278 Other lack of coordination: Secondary | ICD-10-CM

## 2022-10-03 DIAGNOSIS — M79642 Pain in left hand: Secondary | ICD-10-CM

## 2022-10-03 DIAGNOSIS — M6281 Muscle weakness (generalized): Secondary | ICD-10-CM

## 2022-10-07 ENCOUNTER — Ambulatory Visit (INDEPENDENT_AMBULATORY_CARE_PROVIDER_SITE_OTHER)
Admission: RE | Admit: 2022-10-07 | Discharge: 2022-10-07 | Disposition: A | Discharge status: Home / Self Care | Payer: BC Managed Care – PPO | Source: Ambulatory Visit | Attending: Physician Assistant | Admitting: Physician Assistant

## 2022-10-07 DIAGNOSIS — E2839 Other primary ovarian failure: Secondary | ICD-10-CM

## 2022-10-08 NOTE — Therapy (Signed)
OUTPATIENT PHYSICAL THERAPY EVALUATION   Patient Name: Robin Stevens MRN: [id number] DOB:02-12-1956, 67 y.o., female Today's Date: 10/10/2022  END OF SESSION:  PT End of Session - 10/10/22 0801     Visit Number 1    Number of Visits 20    Date for PT Re-Evaluation 12/19/22    Authorization Type BCBS    Progress Note Due on Visit 10    PT Start Time 0804    PT Stop Time 0833    PT Time Calculation (min) 29 min    Activity Tolerance Patient tolerated treatment well    Behavior During Therapy [facility] for tasks assessed/performed             Past Medical History:  Diagnosis Date   Allergy 2017   Seasonal started after relocate to [place]   Arthritis    would like to confirm if I have arthritis   Cataract    Depression 2008-09   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   GERD (gastroesophageal reflux disease)    History of chicken pox    Macular degeneration of both eyes 2016   Past Surgical History:  Procedure Laterality Date   EYE SURGERY  March 2022   Cataract both eyes   HERNIA REPAIR Bilateral 1999   Inguinal    removal of ovarian cyst Right 1995   THROAT SURGERY  2009   Zinkers Diverticulum   VAGINAL DELIVERY  1985, 1987, 1989   Patient Active Problem List   Diagnosis Date Noted   Dyslipidemia 09/03/2020   Environmental and seasonal allergies 08/23/2018   Vitamin D deficiency 08/17/2017   Arthralgia of both hands 08/17/2017   Macular degeneration of both eyes 03/10/2014    PCP: Jarold Motto PA  REFERRING PROVIDER: Rodolph Bong, MD  REFERRING DIAG: [id number] (ICD-10-CM) - Acute pain of right knee  THERAPY DIAG:  Muscle weakness (generalized)  Acute pain of right knee  Difficulty in walking, not elsewhere classified  Rationale for Evaluation and Treatment: Rehabilitation  ONSET DATE: April 2024 acute on chronic  SUBJECTIVE:   SUBJECTIVE STATEMENT: Pt reported Rt knee pain with sliding off a bike seat and hitting Rt knee on the  bike. She had reported some complaints prior to incident on bike. Reported having improvements since injection.   Reported having some difficulty c motion for yoga poses. Reported hiking and going up stairs can be troublesome.  Denied trouble sleeping due to knee pain.   PERTINENT HISTORY: Concurrent Lt thumb pain with visit to OT recently.   PAIN:  NPRS scale: current: 4/10   at worst  10/10 Pain location: Rt knee medially Pain description: ache Aggravating factors: positions for yoga, going up stairs/walking up hills, prolonged hiking Relieving factors: injection  PRECAUTIONS: None  WEIGHT BEARING RESTRICTIONS: No  FALLS:  Has patient fallen in last 6 months? 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   PLOF: Independent, hiking, yoga, bike, kayak  PATIENT GOALS: Reduce pain, improve physical activity.     OBJECTIVE:   PATIENT SURVEYS:  10/10/2022 FOTO intake: 61   predicted:  75  COGNITION: 10/10/2022 Overall cognitive status: WFL    SENSATION: 10/10/2022 WFL  EDEMA:  10/10/2022 none  MUSCLE LENGTH: 10/10/2022 No specific testing  POSTURE:  10/10/2022 Unremarkable   PALPATION: 10/10/2022 Unremarkable   LOWER EXTREMITY ROM:   ROM Right 10/10/2022 Left 10/10/2022  Hip flexion    Hip extension    Hip abduction    Hip adduction    Hip internal rotation    Hip external rotation    Knee flexion [facility]   Knee extension 0   Ankle dorsiflexion    Ankle plantarflexion    Ankle inversion    Ankle eversion     (Blank rows = not tested)  LOWER EXTREMITY MMT:  MMT Right 10/10/2022 Left 10/10/2022  Hip flexion 5/5 5/5  Hip extension 5/5 5/5  Hip abduction 4/5 4+/5  Hip adduction    Hip internal rotation    Hip external rotation    Knee flexion 5/5 5/5  Knee extension 4+/5  35.3, 33.5 lbs 5/5  47.3, 47.5  Ankle dorsiflexion 5/5 5/5   Ankle plantarflexion    Ankle inversion    Ankle eversion     (Blank rows = not tested)  LOWER EXTREMITY SPECIAL TESTS:  10/10/2022 No specific testing  FUNCTIONAL TESTS:  10/10/2022 18 inch chair transfer: without UE on 1st without complaints.  Lt SLS: 30 seconds  Rt SLS: 30 seconds with increased aberrant movement compared Lt.   GAIT: 10/10/2022 Independent gait s deviations noted in clinic distances.                                                                                                                                                                         TODAY'S TREATMENT                                                                          DATE:10/10/2022 Therex:    HEP instruction/performance c cues for techniques, handout provided.  Trial set performed of each for comprehension and symptom assessment.  See below for exercise list  PATIENT EDUCATION:  10/10/2022 Education details: HEP, POC Person educated: Patient Education method: Explanation, Demonstration, Verbal cues, and Handouts Education comprehension: verbalized understanding, returned demonstration, and verbal cues required  HOME EXERCISE PROGRAM: Access Code: [email or web address] URL: [email or web address] Date: 10/10/2022 Prepared by: Chyrel Masson  Exercises - Sidelying Hip Abduction  - 1-2 x daily - 7 x weekly - 2-3 sets - 10-15 reps - Sit to Stand  - 1-2 x daily - 7 x weekly - 2-3 sets - 10-15 reps - Seated Quad Set  - 3-5 x daily - 7 x weekly - 1 sets - 10 reps - 5 hold - Seated Straight Leg Heel Taps  - 1-2 x daily -  7 x weekly - 3 sets - 10 reps  ASSESSMENT:  CLINICAL IMPRESSION: Patient is a 67 y.o. who comes to clinic with complaints of Rt knee pain with mobility, strength and movement coordination deficits that impair their ability to perform usual daily and recreational functional activities without increase difficulty/symptoms at this time.  Patient to benefit from skilled PT services  to address impairments and limitations to improve to previous level of function without restriction secondary to condition.   OBJECTIVE IMPAIRMENTS: decreased activity tolerance, decreased balance, decreased coordination, decreased endurance, difficulty walking, decreased strength, impaired perceived functional ability, improper body mechanics, and pain.   ACTIVITY LIMITATIONS: carrying, lifting, bending, standing, squatting, stairs, transfers, and locomotion level  PARTICIPATION LIMITATIONS: cleaning, laundry, interpersonal relationship, community activity, and exercise  PERSONAL FACTORS:  no specific factors  are also affecting patient's functional outcome.   REHAB POTENTIAL: Good  CLINICAL DECISION MAKING: Stable/uncomplicated  EVALUATION COMPLEXITY: Low   GOALS: Goals reviewed with patient? Yes  SHORT TERM GOALS: (target date for Short term goals are 3 weeks 10/31/2022)   1.  Patient will demonstrate independent use of home exercise program to maintain progress from in clinic treatments.  Goal status: New  LONG TERM GOALS: (target dates for all long term goals are 10 weeks  12/19/2022 )   1. Patient will demonstrate/report pain at worst less than or equal to 2/10 to facilitate minimal limitation in daily activity secondary to pain symptoms.  Goal status: New   2. Patient will demonstrate independent use of home exercise program to facilitate ability to maintain/progress functional gains from skilled physical therapy services.  Goal status: New   3. Patient will demonstrate FOTO outcome > or = 75 % to indicate reduced disability due to condition.  Goal status: New   4.  Patient will demonstrate Rt  LE MMT 5/5, with 10 % knee extension dynamometry to faciltiate usual transfers, stairs, squatting at PLOF for daily life.   Goal status: New   5.  Patient will demonstrate/report ability to ascend/descend stairs reciprocally without UE assist for household and hiking  improvements.   Goal status: New   6.  Patient will demonstrate/report ability to perform exercise routine at [facility].  Goal status: New     PLAN:  PT FREQUENCY: 1-2x/week  PT DURATION: 10 weeks  PLANNED INTERVENTIONS: Therapeutic exercises, Therapeutic activity, Neuro Muscular re-education, Balance training, Gait training, Patient/Family education, Joint mobilization, Stair training, DME instructions, Dry Needling, Electrical stimulation, Traction, Cryotherapy, vasopneumatic deviceMoist heat, Taping, Ultrasound, Ionotophoresis 4mg /ml Dexamethasone, and aquatic therapy, Manual therapy.  All included unless contraindicated  PLAN FOR NEXT SESSION: Review HEP knowledge/results.   Progressive strengthening improvements (leg press, stair )   Chyrel Masson, PT, DPT, OCS, ATC 10/10/22  8:36 AM

## 2022-10-09 DIAGNOSIS — E2839 Other primary ovarian failure: Secondary | ICD-10-CM | POA: Diagnosis not present

## 2022-10-10 ENCOUNTER — Encounter: Payer: Self-pay | Admitting: Rehabilitative and Restorative Service Providers"

## 2022-10-10 ENCOUNTER — Ambulatory Visit: Payer: BC Managed Care – PPO | Admitting: Rehabilitative and Restorative Service Providers"

## 2022-10-10 ENCOUNTER — Other Ambulatory Visit: Payer: Self-pay

## 2022-10-10 DIAGNOSIS — M25561 Pain in right knee: Secondary | ICD-10-CM

## 2022-10-10 DIAGNOSIS — R262 Difficulty in walking, not elsewhere classified: Secondary | ICD-10-CM

## 2022-10-10 DIAGNOSIS — M6281 Muscle weakness (generalized): Secondary | ICD-10-CM | POA: Diagnosis not present

## 2022-10-14 NOTE — Therapy (Signed)
OUTPATIENT OCCUPATIONAL THERAPY TREATMENT NOTE  Patient Name: Robin Stevens MRN: [id number] DOB:[birth year], 67 y.o., female Today's Date: 10/16/2022  PCP: Jarold Motto, PA REFERRING PROVIDER: Rodolph Bong, MD   END OF SESSION:  OT End of Session - 10/16/22 0847     Visit Number 2    Number of Visits 6    Date for OT Re-Evaluation 11/14/22    Authorization Type BCBS    OT Start Time 0847    OT Stop Time 0932    OT Time Calculation (min) 45 min    Activity Tolerance Patient tolerated treatment well;No increased pain;Patient limited by fatigue    Behavior During Therapy [facility] for tasks assessed/performed              Past Medical History:  Diagnosis Date   Allergy 2017   Seasonal started after relocate to [place]   Arthritis    would like to confirm if I have arthritis   Cataract    Depression 2008-09   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   GERD (gastroesophageal reflux disease)    History of chicken pox    Macular degeneration of both eyes 2016   Past Surgical History:  Procedure Laterality Date   EYE SURGERY  March 2022   Cataract both eyes   HERNIA REPAIR Bilateral 1999   Inguinal    removal of ovarian cyst Right 1995   THROAT SURGERY  2009   Zinkers Diverticulum   VAGINAL DELIVERY  1985, 1987, 1989   Patient Active Problem List   Diagnosis Date Noted   Dyslipidemia 09/03/2020   Environmental and seasonal allergies 08/23/2018   Vitamin D deficiency 08/17/2017   Arthralgia of both hands 08/17/2017   Macular degeneration of both eyes 03/10/2014    ONSET DATE: approx 3 months onset  REFERRING DIAG: M79.645,G89.29 (ICD-10-CM) - Chronic pain of left thumb   THERAPY DIAG:  Other lack of coordination  Pain in left hand  Rationale for Evaluation and Treatment: Rehabilitation  PERTINENT HISTORY: Pe rreferral: "Evaluate and Treat for L thumb pain. 1-2 times per week for 4-6 weeks.  Decrease pain, increase strength, flexibility,  function, and range of motion.  Modalities may include, traction, ionto, phono, stim, and dry needling prn." She states she has developed Lt basal joint thumb pain. She also does yoga, is an extensive typist at work, enjoys Pharmacist, community and active lifestyle.  She states intermittent tingling in her fingers at times that may be related to sleeping postures.  She states dropping things at times due to pain, difficulty holding and gripping objects with the left hand especially.  PRECAUTIONS: None  RED FLAGS: None   WEIGHT BEARING RESTRICTIONS: No    SUBJECTIVE:   SUBJECTIVE STATEMENT: She states she feels like she is managing her thumb pain much better now.  It has hurt her less and less frequently.  She states understanding her exercises and ready to learn the last couple of exercises.Marland Kitchen     PAIN:  Are you having pain?  Yes: NPRS scale: 1/10 at rest now, when reaching/gripping up to 4-510 Pain location: Lt basla joint of thumb Pain description: sharp at times, aching most times Aggravating factors: strong gripping Relieving factors: rest, heat   FALLS: Has patient fallen in last 6 months? No  LIVING ENVIRONMENT: Lives with: lives with their spouse Lives in: House/apartment Has following equipment at home: None  PLOF: Independent  PATIENT GOALS: To get strategies and exercises to help reduce pain in her  left thumb   OBJECTIVE: (All objective assessments below are from initial evaluation on: 10/03/22 unless otherwise specified.)   HAND DOMINANCE: Right   ADLs: Overall ADLs: States decreased ability to grab, hold household objects, pain and inability to open containers, perform FMS tasks (manipulate fasteners on clothing), mild to moderate bathing problems as well.    FUNCTIONAL OUTCOME MEASURES: Eval: Quick DASH 34% impairment today  (Higher % Score  =  More Impairment)     UPPER EXTREMITY ROM     Shoulder to Wrist AROM Right eval Left eval  Wrist flexion 63 68  Wrist  extension 55 79  (Blank rows = not tested)   Hand AROM Right eval Left eval  Full Fist Ability (or Gap to Distal Palmar Crease)  full  Thumb Opposition  (Kapandji Scale)   10 with pain  Thumb MCP (0-60) 51 45  Thumb IP (0-80) 76 72  Thumb Radial Abduction Span     Thumb Palmar Abduction Span     (Blank rows = not tested)   UPPER EXTREMITY MMT:    Eval: Bilateral wrists were grossly 4/5 MMT or better in flex/ext; Lt thumb was 4-/5 pain in flexion, but 4/5 MMT in all other planes  HAND FUNCTION: Eval: Observed weakness in affected hand.  Grip strength Right: 42 lbs, Left: 29 lbs Tip pinch Rt: 8#; Lt: 3#    COORDINATION: Eval: Mild observed coordination impairments with affected Lt hand. Details TBD   EDEMA:   Eval: Mild swelling in basal joints of thumbs as well as MCP Js in both hands, mild red as well   OBSERVATIONS:   Eval: intact muscles with at least fair strength about the Lt thumb, some obvious collapse deformity starting with hyper ext of MCP J and difficulty abducting the 1st metacarpal (this is not present in Rt thumb). Some complaints of intermittent paresthesia in both hands (worse at night).  She also seems to have a tight thumb adductor pollicis in the left webspace  Presents like a collapse deformity beginning in the left thumb as well as a stiff right wrist and mild, intermittent paresthesias due to nerve compression.   TODAY'S TREATMENT:  10/16/22: OT educated on preventative measures to prevent lateral pinching and she is given a handout with ways to reduce wear and tear and stress on the [facility] joints of the thumbs.  Next, she was educated on supportive bracing that she could use and the 3 levels of support: Kinesiotape, neoprene soft braces, rigid bracing.  OT educated on Kinesiotape use and applies this to her thumb which she states relieves some pressure and pain.  She is educated on how to do it herself at home.  She states she will bring in her current level to  support at tomorrow's session and consider a level 3 support for chronic and long-term use as needed.  Lastly her home exercises were reviewed today which she did well with, and then OT educates on the 2 remaining exercises to strengthen the first dorsal interossei muscle as well as the flexor pollicis brevis and opponens pollicis muscles.  These are bolded below, she did well with these and had no increase in pain.   Exercises - Seated Wrist Flexion Stretch  - 3 x daily - 3 reps - 15 hold - Wrist Prayer Stretch  - 3 x daily - 3 reps - 15 sec hold - Stretch Thumb DOWNWARD  - 3 x daily - 3 reps - 15 sec hold - Thumb Webspace  Stretch  - 3 x daily - 3 reps - 15 sec hold - Towel Roll Grip with Forearm in Neutral  - 3 x daily - 5 reps - 10 sec hold - Spread Index Finger Apart  - 3 x daily - 5 reps - 5 sec hold - C-Strength (try using rubber band)   - 3 x daily - 5 reps - 5 sec holds   PATIENT EDUCATION: Education details: See tx section above for details  Person educated: Patient Education method: Verbal Instruction, Teach back, Handouts  Education comprehension: States and demonstrates understanding, Additional Education required    HOME EXERCISE PROGRAM: Access Code: [email or web address] URL: [email or web address]   GOALS: Goals reviewed with patient? Yes   SHORT TERM GOALS: (STG required if POC>30 days) Target Date: 10/17/22  Pt will obtain protective, custom orthotic. Goal status: TBD/PRN  2.  Pt will demo/state understanding of initial HEP to improve pain levels and prerequisite motion. Goal status: 10/16/22: MET   LONG TERM GOALS: Target Date: 11/14/22  Pt will improve functional ability by decreased impairment per Quick DASH assessment from 34% to 15% or better, for better quality of life. Goal status: INITIAL  2.  Pt will improve grip strength in Lt hand from 29lbs to at least 35lbs for functional use at home and in IADLs. Goal status: INITIAL  3.  Pt will improve A/ROM  in Rt wrist flex/ext to at least 65* each, to have functional motion for tasks like reach and grasp.  Goal status: INITIAL  4.  Pt will decrease pain at worst from 10/10 to 4/10 or better to have better sleep and occupational participation in daily roles. Goal status: INITIAL   ASSESSMENT:  CLINICAL IMPRESSION: 10/16/22: She will be seen again tomorrow to take a look at her bracing options and perhaps get a long-term custom orthosis for management of chronic pains.  Otherwise, it seems that she is managing well, understands her home exercises, and when she feels able to manage on her own she will be discharged to her own care and maintenance of pains.  Eval: Patient is a 67 y.o. female who was seen today for occupational therapy evaluation for left hand and thumb pain, though she is also having some right wrist stiffness and occasional bilateral tingling sensations (nighttime nerve compression).  These things negatively impact her daily life, her ability to pick up and hold objects, do her ADLs and IADLs.  She will benefit from outpatient occupational therapy to increase quality of life.      PLAN:  OT FREQUENCY: 1-2x/week  OT DURATION: 6 weeks as needed/able through 11/14/22 and up to 6 total visits  PLANNED INTERVENTIONS: self care/ADL training, therapeutic exercise, therapeutic activity, neuromuscular re-education, manual therapy, passive range of motion, splinting, ultrasound, compression bandaging, moist heat, cryotherapy, contrast bath, patient/family education, energy conservation, coping strategies training, DME and/or AE instructions, Re-evaluation, and Dry needling  CONSULTED AND AGREED WITH PLAN OF CARE: Patient  PLAN FOR NEXT SESSION:   Check home exercises, consider custom orthosis for support, consider discharge as soon as all goals are met.  Fannie Knee, OTR/L 10/16/2022, 4:54 PM

## 2022-10-16 ENCOUNTER — Ambulatory Visit: Payer: BC Managed Care – PPO | Admitting: Rehabilitative and Restorative Service Providers"

## 2022-10-16 ENCOUNTER — Encounter: Payer: Self-pay | Admitting: Rehabilitative and Restorative Service Providers"

## 2022-10-16 DIAGNOSIS — M79642 Pain in left hand: Secondary | ICD-10-CM | POA: Diagnosis not present

## 2022-10-16 DIAGNOSIS — R278 Other lack of coordination: Secondary | ICD-10-CM

## 2022-10-16 NOTE — Therapy (Signed)
OUTPATIENT OCCUPATIONAL THERAPY TREATMENT NOTE  Patient Name: Robin Stevens MRN: [id number] DOB:[birth year], 67 y.o., female Today's Date: 10/17/2022  PCP: Jarold Motto, PA REFERRING PROVIDER: Rodolph Bong, MD   END OF SESSION:  OT End of Session - 10/17/22 0758     Visit Number 3    Number of Visits 6    Date for OT Re-Evaluation 11/14/22    Authorization Type BCBS    OT Start Time 0800    OT Stop Time 0840    OT Time Calculation (min) 40 min    Equipment Utilized During Treatment orthotic materials    Activity Tolerance Patient tolerated treatment well;No increased pain    Behavior During Therapy [facility] for tasks assessed/performed               Past Medical History:  Diagnosis Date   Allergy 2017   Seasonal started after relocate to [place]   Arthritis    would like to confirm if I have arthritis   Cataract    Depression 2008-09   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   GERD (gastroesophageal reflux disease)    History of chicken pox    Macular degeneration of both eyes 2016   Past Surgical History:  Procedure Laterality Date   EYE SURGERY  March 2022   Cataract both eyes   HERNIA REPAIR Bilateral 1999   Inguinal    removal of ovarian cyst Right 1995   THROAT SURGERY  2009   Zinkers Diverticulum   VAGINAL DELIVERY  1985, 1987, 1989   Patient Active Problem List   Diagnosis Date Noted   Dyslipidemia 09/03/2020   Environmental and seasonal allergies 08/23/2018   Vitamin D deficiency 08/17/2017   Arthralgia of both hands 08/17/2017   Macular degeneration of both eyes 03/10/2014    ONSET DATE: approx 3 months onset  REFERRING DIAG: M79.645,G89.29 (ICD-10-CM) - Chronic pain of left thumb   THERAPY DIAG:  Other lack of coordination  Pain in left hand  Rationale for Evaluation and Treatment: Rehabilitation  PERTINENT HISTORY: Pe rreferral: "Evaluate and Treat for L thumb pain. 1-2 times per week for 4-6 weeks.  Decrease pain,  increase strength, flexibility, function, and range of motion.  Modalities may include, traction, ionto, phono, stim, and dry needling prn." She states she has developed Lt basal joint thumb pain. She also does yoga, is an extensive typist at work, enjoys Pharmacist, community and active lifestyle.  She states intermittent tingling in her fingers at times that may be related to sleeping postures.  She states dropping things at times due to pain, difficulty holding and gripping objects with the left hand especially.  PRECAUTIONS: None  RED FLAGS: None   WEIGHT BEARING RESTRICTIONS: No    SUBJECTIVE:   SUBJECTIVE STATEMENT: She states that she is still trying to work on her new "rubber band" exercises and she would like some more time to try to understand them and have self-management.  She is also feeling much better and having low resting pain and nothing has bothered her very much lately.  She also brings her own prefabricated brace in for a check and it appears old and soiled.   PAIN:  Are you having pain?   Yes: NPRS scale: 1/10 at rest now, when reaching/gripping up to 4-5/10 in past week Pain location: Lt basla joint of thumb Pain description: sharp at times, aching most times Aggravating factors: strong gripping Relieving factors: rest, heat   FALLS: Has patient fallen in  last 6 months? No  LIVING ENVIRONMENT: Lives with: lives with their spouse Lives in: House/apartment Has following equipment at home: None  PLOF: Independent  PATIENT GOALS: To get strategies and exercises to help reduce pain in her left thumb   OBJECTIVE: (All objective assessments below are from initial evaluation on: 10/03/22 unless otherwise specified.)   HAND DOMINANCE: Right   ADLs: Overall ADLs: States decreased ability to grab, hold household objects, pain and inability to open containers, perform FMS tasks (manipulate fasteners on clothing), mild to moderate bathing problems as well.    FUNCTIONAL  OUTCOME MEASURES: Eval: Quick DASH 34% impairment today  (Higher % Score  =  More Impairment)     UPPER EXTREMITY ROM     Shoulder to Wrist AROM Right eval Left eval  Wrist flexion 63 68  Wrist extension 55 79  (Blank rows = not tested)   Hand AROM Right eval Left eval  Full Fist Ability (or Gap to Distal Palmar Crease)  full  Thumb Opposition  (Kapandji Scale)   10 with pain  Thumb MCP (0-60) 51 45  Thumb IP (0-80) 76 72  Thumb Radial Abduction Span     Thumb Palmar Abduction Span     (Blank rows = not tested)   UPPER EXTREMITY MMT:    Eval: Bilateral wrists were grossly 4/5 MMT or better in flex/ext; Lt thumb was 4-/5 pain in flexion, but 4/5 MMT in all other planes  HAND FUNCTION: Eval: Observed weakness in affected hand.  Grip strength Right: 42 lbs, Left: 29 lbs Tip pinch Rt: 8#; Lt: 3#    COORDINATION: Eval: Mild observed coordination impairments with affected Lt hand. Details TBD   EDEMA:   Eval: Mild swelling in basal joints of thumbs as well as MCP Js in both hands, mild red as well   OBSERVATIONS:   Eval: intact muscles with at least fair strength about the Lt thumb, some obvious collapse deformity starting with hyper ext of MCP J and difficulty abducting the 1st metacarpal (this is not present in Rt thumb). Some complaints of intermittent paresthesia in both hands (worse at night).  She also seems to have a tight thumb adductor pollicis in the left webspace  Presents like a collapse deformity beginning in the left thumb as well as a stiff right wrist and mild, intermittent paresthesias due to nerve compression.   TODAY'S TREATMENT:  10/17/22: Today OT briefly reviews her home exercise program including the new strengthening with index finger abduction and thumb opposition and flexion.  Next, OT takes a look at the supportive brace that she owns which is soft and mildly supportive but also old and soiled.  To help her manage her long-term chronic wear and tear,  pain to the left thumb CMC joint, OT custom fabricates a custom rigid orthotic hand-based CMC and MCP joint support leaving the IP joint free and the wrist free.  Once done, she states it fits very well it does not irritate her at all and it supports her thumb and prevents her from having pain at the [facility] joint.  She was educated to wear it only as needed for activities that are typically stressful or painful to her thumb joint.  She also could wear it at night if she tends to make a fist or have pain or swelling through the night.  She states understanding.  Briefly we discuss her goals for therapy which she may be meeting at this point, and rather than discharge today  she feels more comfortable following up in about a month after self-management.  To see if she able to self manage and able to keep her pains under control while staying highly functional and active.  OT is in agreement with this and we will put her plan of care on hold for about 4 weeks for period of self-management.   Exercises - Seated Wrist Flexion Stretch  - 3 x daily - 3 reps - 15 hold - Wrist Prayer Stretch  - 3 x daily - 3 reps - 15 sec hold - Stretch Thumb DOWNWARD  - 3 x daily - 3 reps - 15 sec hold - Thumb Webspace Stretch  - 3 x daily - 3 reps - 15 sec hold - Towel Roll Grip with Forearm in Neutral  - 3 x daily - 5 reps - 10 sec hold - Spread Index Finger Apart  - 3 x daily - 5 reps - 5 sec hold - C-Strength (try using rubber band)   - 3 x daily - 5 reps - 5 sec holds   PATIENT EDUCATION: Education details: See tx section above for details  Person educated: Patient Education method: Verbal Instruction, Teach back, Handouts  Education comprehension: States and demonstrates understanding, Additional Education required    HOME EXERCISE PROGRAM: Access Code: [email or web address] URL: [email or web address]   GOALS: Goals reviewed with patient? Yes   SHORT TERM GOALS: (STG required if POC>30 days) Target Date:  10/17/22  Pt will obtain protective, custom orthotic. Goal status: 10/17/22: MET  2.  Pt will demo/state understanding of initial HEP to improve pain levels and prerequisite motion. Goal status: 10/16/22: MET   LONG TERM GOALS: Target Date: 11/14/22  Pt will improve functional ability by decreased impairment per Quick DASH assessment from 34% to 15% or better, for better quality of life. Goal status: INITIAL  2.  Pt will improve grip strength in Lt hand from 29lbs to at least 35lbs for functional use at home and in IADLs. Goal status: INITIAL  3.  Pt will improve A/ROM in Rt wrist flex/ext to at least 65* each, to have functional motion for tasks like reach and grasp.  Goal status: INITIAL  4.  Pt will decrease pain at worst from 10/10 to 4/10 or better to have better sleep and occupational participation in daily roles. Goal status: INITIAL   ASSESSMENT:  CLINICAL IMPRESSION: 10/17/22: She is doing very well and has little to no pain now at rest also has a new custom orthosis for the left thumb CMC joint and MCP joints.  She has a full home exercise plan but states that she needs more time to work on it and try self-management on her own.  Due to that we will put her therapy on hold for about a month of self-management and when she returns we will do a progress note to see if she requires any more therapy at that time.    PLAN:  OT FREQUENCY: 1-2x/week  OT DURATION: 6 weeks as needed/able through 11/14/22 and up to 6 total visits  PLANNED INTERVENTIONS: self care/ADL training, therapeutic exercise, therapeutic activity, neuromuscular re-education, manual therapy, passive range of motion, splinting, ultrasound, compression bandaging, moist heat, cryotherapy, contrast bath, patient/family education, energy conservation, coping strategies training, DME and/or AE instructions, Re-evaluation, and Dry needling  CONSULTED AND AGREED WITH PLAN OF CARE: Patient  PLAN FOR NEXT SESSION:   After  about 4 weeks of therapy put on hold-see her back for progress note review of self-management  techniques and likely discharge if she is doing well.  If she does not return after 4 to 6 weeks, she can be discharged assuming that she has met all goals.  She is in agreement with this plan today  Fannie Knee, OTR/L 10/17/2022, 8:52 AM

## 2022-10-17 ENCOUNTER — Encounter: Payer: Self-pay | Admitting: Rehabilitative and Restorative Service Providers"

## 2022-10-17 ENCOUNTER — Ambulatory Visit: Payer: BC Managed Care – PPO | Admitting: Rehabilitative and Restorative Service Providers"

## 2022-10-17 DIAGNOSIS — M79642 Pain in left hand: Secondary | ICD-10-CM | POA: Diagnosis not present

## 2022-10-17 DIAGNOSIS — R278 Other lack of coordination: Secondary | ICD-10-CM | POA: Diagnosis not present

## 2022-10-21 ENCOUNTER — Encounter: Payer: BC Managed Care – PPO | Admitting: Rehabilitative and Restorative Service Providers"

## 2022-10-21 NOTE — Therapy (Addendum)
OUTPATIENT PHYSICAL THERAPY TREATMENT / DISCHARGE   Patient Name: Robin Stevens MRN: [id number] DOB:Jun 10, 1955, 67 y.o., female Today's Date: 10/22/2022  END OF SESSION:  PT End of Session - 10/22/22 0758     Visit Number 2    Number of Visits 20    Date for PT Re-Evaluation 12/19/22    Authorization Type BCBS    Progress Note Due on Visit 10    PT Start Time 0757    PT Stop Time 0835    PT Time Calculation (min) 38 min    Activity Tolerance Patient tolerated treatment well    Behavior During Therapy [facility] for tasks assessed/performed              Past Medical History:  Diagnosis Date   Allergy 2017   Seasonal started after relocate to [place]   Arthritis    would like to confirm if I have arthritis   Cataract    Depression 2008-09   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   GERD (gastroesophageal reflux disease)    History of chicken pox    Macular degeneration of both eyes 2016   Past Surgical History:  Procedure Laterality Date   EYE SURGERY  March 2022   Cataract both eyes   HERNIA REPAIR Bilateral 1999   Inguinal    removal of ovarian cyst Right 1995   THROAT SURGERY  2009   Zinkers Diverticulum   VAGINAL DELIVERY  1985, 1987, 1989   Patient Active Problem List   Diagnosis Date Noted   Dyslipidemia 09/03/2020   Environmental and seasonal allergies 08/23/2018   Vitamin D deficiency 08/17/2017   Arthralgia of both hands 08/17/2017   Macular degeneration of both eyes 03/10/2014    PCP: Jarold Motto PA  REFERRING PROVIDER: Rodolph Bong, MD  REFERRING DIAG: [phone] (ICD-10-CM) - Acute pain of right knee  THERAPY DIAG:  Acute pain of right knee  Muscle weakness (generalized)  Difficulty in walking, not elsewhere classified  Rationale for Evaluation and Treatment: Rehabilitation  ONSET DATE: April 2024 acute on chronic  SUBJECTIVE:   SUBJECTIVE STATEMENT: Pt indicated having some achy in hips since starting exercises, not  pain.  Pt indicated knee has been hurting less.  Good   PERTINENT HISTORY: Concurrent Lt thumb pain with visit to OT recently.   PAIN:  NPRS scale:no specific pain reported upon arrival.  Pain location: Rt knee medially Pain description: ache Aggravating factors: positions for yoga, going up stairs/walking up hills, prolonged hiking Relieving factors: injection  PRECAUTIONS: None  WEIGHT BEARING RESTRICTIONS: No  FALLS:  Has patient fallen in last 6 months? 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   PLOF: Independent, hiking, yoga, bike, kayak  PATIENT GOALS: Reduce pain, improve physical activity.     OBJECTIVE:   PATIENT SURVEYS:  10/22/2022: FOTO update:  77  10/10/2022 FOTO intake: 61   predicted:  75  COGNITION: 10/10/2022 Overall cognitive status: WFL    SENSATION: 10/10/2022 WFL  EDEMA:  10/10/2022 none  MUSCLE LENGTH: 10/10/2022 No specific testing  POSTURE:  10/10/2022 Unremarkable   PALPATION: 10/10/2022 Unremarkable   LOWER EXTREMITY ROM:   ROM Right 10/10/2022 Left 10/10/2022  Hip flexion    Hip extension    Hip abduction    Hip adduction    Hip internal rotation    Hip external rotation  Knee flexion [facility]   Knee extension 0   Ankle dorsiflexion    Ankle plantarflexion    Ankle inversion    Ankle eversion     (Blank rows = not tested)  LOWER EXTREMITY MMT:  MMT Right 10/10/2022 Left 10/10/2022 Right 10/22/2022  Hip flexion 5/5 5/5   Hip extension 5/5 5/5   Hip abduction 4/5 4+/5   Hip adduction     Hip internal rotation     Hip external rotation     Knee flexion 5/5 5/5   Knee extension 4+/5  35.3, 33.5 lbs 5/5  47.3, 47.5 5/5 47, 43.2 lbs  Ankle dorsiflexion 5/5 5/5   Ankle plantarflexion     Ankle inversion     Ankle eversion      (Blank rows = not tested)  LOWER EXTREMITY SPECIAL TESTS:   10/10/2022 No specific testing  FUNCTIONAL TESTS:  10/10/2022 18 inch chair transfer: without UE on 1st without complaints.  Lt SLS: 30 seconds  Rt SLS: 30 seconds with increased aberrant movement compared Lt.   GAIT: 10/10/2022 Independent gait s deviations noted in clinic distances.                                                                                                                                                                         TODAY'S TREATMENT                                                                          DATE: 10/21/2022 Therex: Nustep lvl 6 10 mins  Step up forward WB on Rt leg x 5 6 inch step Lateral step up/down 6 inch step x 15 bilateral  (cues for home use) Cues for adjustments of HEP to reduce sets/reps if necessary due to excessive hip muscle fatigue.  Reviewed cues of select intervention for home program.   Neuro Re-ed Lateral stepping 3 cones x 6 each bilaterally SLS with contralateral leg step over touch/return 9 inch hurdle with occasional HHA x 15 bilateral slowly    TODAY'S TREATMENT  DATE:10/10/2022 Therex:    HEP instruction/performance c cues for techniques, handout provided.  Trial set performed of each for comprehension and symptom assessment.  See below for exercise list  PATIENT EDUCATION:  10/10/2022 Education details: HEP, POC Person educated: Patient Education method: Explanation, Demonstration, Verbal cues, and Handouts Education comprehension: verbalized understanding, returned demonstration, and verbal cues required  HOME EXERCISE PROGRAM: Access Code: [email or web address] URL: [email or web address] Date: 10/10/2022 Prepared by: Chyrel Masson  Exercises - Sidelying Hip Abduction  - 1-2 x daily - 7 x weekly - 2-3 sets - 10-15 reps - Sit to Stand  - 1-2 x daily - 7 x weekly - 2-3 sets - 10-15 reps - Seated Quad Set  - 3-5 x daily - 7 x weekly - 1  sets - 10 reps - 5 hold - Seated Straight Leg Heel Taps  - 1-2 x daily - 7 x weekly - 3 sets - 10 reps  ASSESSMENT:  CLINICAL IMPRESSION: Pt reported improvement in knee symptoms and demonstrated improvement in knee extension MMT and FOTO reassessment today.  Plan to continue HEP for strengthening.  Has one more visit scheduled a few weeks out for follow up on results from hiking experience.   OBJECTIVE IMPAIRMENTS: decreased activity tolerance, decreased balance, decreased coordination, decreased endurance, difficulty walking, decreased strength, impaired perceived functional ability, improper body mechanics, and pain.   ACTIVITY LIMITATIONS: carrying, lifting, bending, standing, squatting, stairs, transfers, and locomotion level  PARTICIPATION LIMITATIONS: cleaning, laundry, interpersonal relationship, community activity, and exercise  PERSONAL FACTORS:  no specific factors  are also affecting patient's functional outcome.   REHAB POTENTIAL: Good  CLINICAL DECISION MAKING: Stable/uncomplicated  EVALUATION COMPLEXITY: Low   GOALS: Goals reviewed with patient? Yes  SHORT TERM GOALS: (target date for Short term goals are 3 weeks 10/31/2022)   1.  Patient will demonstrate independent use of home exercise program to maintain progress from in clinic treatments.  Goal status: Met   LONG TERM GOALS: (target dates for all long term goals are 10 weeks  12/19/2022 )   1. Patient will demonstrate/report pain at worst less than or equal to 2/10 to facilitate minimal limitation in daily activity secondary to pain symptoms.  Goal status: New   2. Patient will demonstrate independent use of home exercise program to facilitate ability to maintain/progress functional gains from skilled physical therapy services.  Goal status: New   3. Patient will demonstrate FOTO outcome > or = 75 % to indicate reduced disability due to condition.  Goal status: Met 10/22/2022   4.  Patient will  demonstrate Rt  LE MMT 5/5, with 10 % knee extension dynamometry to faciltiate usual transfers, stairs, squatting at PLOF for daily life.   Goal status: Met 10/22/2022   5.  Patient will demonstrate/report ability to ascend/descend stairs reciprocally without UE assist for household and hiking improvements.   Goal status: New   6.  Patient will demonstrate/report ability to perform exercise routine at [facility].  Goal status: New     PLAN:  PT FREQUENCY: 1-2x/week  PT DURATION: 10 weeks  PLANNED INTERVENTIONS: Therapeutic exercises, Therapeutic activity, Neuro Muscular re-education, Balance training, Gait training, Patient/Family education, Joint mobilization, Stair training, DME instructions, Dry Needling, Electrical stimulation, Traction, Cryotherapy, vasopneumatic deviceMoist heat, Taping, Ultrasound, Ionotophoresis 4mg /ml Dexamethasone, and aquatic therapy, Manual therapy.  All included unless contraindicated  PLAN FOR NEXT SESSION: Possible HEP transitioning pending symptoms.    Chyrel Masson, PT, DPT, OCS, ATC 10/22/22  8:38 AM   PHYSICAL THERAPY DISCHARGE SUMMARY  Visits from Start of Care: 2  Current functional level related to goals / functional outcomes: See note   Remaining deficits: See note   Education / Equipment: HEP  Patient goals were partially met. Patient is being discharged due to not returning since the last visit.  Chyrel Masson, PT, DPT, OCS, ATC 12/04/22  1:47 PM

## 2022-10-22 ENCOUNTER — Ambulatory Visit (INDEPENDENT_AMBULATORY_CARE_PROVIDER_SITE_OTHER): Payer: BC Managed Care – PPO | Admitting: Rehabilitative and Restorative Service Providers"

## 2022-10-22 ENCOUNTER — Encounter: Payer: Self-pay | Admitting: Rehabilitative and Restorative Service Providers"

## 2022-10-22 DIAGNOSIS — M6281 Muscle weakness (generalized): Secondary | ICD-10-CM | POA: Diagnosis not present

## 2022-10-22 DIAGNOSIS — M25561 Pain in right knee: Secondary | ICD-10-CM | POA: Diagnosis not present

## 2022-10-22 DIAGNOSIS — R262 Difficulty in walking, not elsewhere classified: Secondary | ICD-10-CM | POA: Diagnosis not present

## 2022-10-28 ENCOUNTER — Encounter: Payer: BC Managed Care – PPO | Admitting: Physical Therapy

## 2022-11-04 ENCOUNTER — Encounter: Payer: BC Managed Care – PPO | Admitting: Rehabilitative and Restorative Service Providers"

## 2022-11-21 ENCOUNTER — Encounter: Payer: Self-pay | Admitting: Physician Assistant

## 2022-11-21 ENCOUNTER — Ambulatory Visit: Payer: BC Managed Care – PPO | Admitting: Physician Assistant

## 2022-11-21 VITALS — BP 122/80 | HR 77 | Temp 97.8°F | Ht 60.0 in | Wt 128.4 lb

## 2022-11-21 DIAGNOSIS — M810 Age-related osteoporosis without current pathological fracture: Secondary | ICD-10-CM | POA: Insufficient documentation

## 2022-11-21 DIAGNOSIS — Z1283 Encounter for screening for malignant neoplasm of skin: Secondary | ICD-10-CM

## 2022-11-21 DIAGNOSIS — M816 Localized osteoporosis [Lequesne]: Secondary | ICD-10-CM

## 2022-11-21 HISTORY — DX: Localized osteoporosis (Lequesne): M81.6

## 2022-11-21 NOTE — Progress Notes (Signed)
Robin Stevens is a 67 y.o. female here for a follow up of a pre-existing problem.  History of Present Illness:   Chief Complaint  Patient presents with   Results    Pt here to discuss Bone Density results and treatment.    HPI  Osteoporosis Patient reports that she received her Dexa scan which came back showing  osteoporosis. She states that she is possibly interested in completing reclast infusions, though unsure if the best avenue for this would be [facility] or the endocrinologist.  Dermatology Patient is requesting a referral for a dermatologist for a general skin check.  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   GERD (gastroesophageal reflux disease)    History of chicken pox    Macular degeneration of both eyes 2016     Social History   Tobacco Use   Smoking status: Never   Smokeless tobacco: Never  Vaping Use   Vaping status: Never Used  Substance Use Topics   Alcohol use: Yes    Alcohol/week: 5.0 standard drinks of alcohol    Types: 5 Glasses of wine per week    Comment: Glass of wine at dinner - less than 6 ounce pour   Drug use: Never    Past Surgical History:  Procedure Laterality Date   EYE SURGERY  March 2022   Cataract both eyes   HERNIA REPAIR Bilateral 1999   Inguinal    removal of ovarian cyst Right 1995   THROAT SURGERY  2009   Zinkers Diverticulum   VAGINAL DELIVERY  1985, 1987, 1989    Family History  Problem Relation Age of Onset   Arthritis Mother    Heart attack Mother    Heart disease Mother    Obesity Mother    Vision loss Mother    Pancreatic cancer Father    Cancer Father    Early death Father    Vision loss Brother    Early death Maternal Grandmother 40       cardiac-related   Heart disease Maternal Grandfather     ADD / ADHD Daughter    Anxiety disorder Daughter    Anxiety disorder Son    Vision loss Maternal Aunt    Anxiety disorder Daughter     Allergies  Allergen Reactions   Sulfa Antibiotics Nausea Only    Current Medications:   Current Outpatient Medications:    acetaminophen (TYLENOL 8 HOUR ARTHRITIS PAIN) 650 MG CR tablet, Take 650 mg by mouth at bedtime., Disp: , Rfl:    b complex vitamins capsule, Take 1 capsule by mouth daily., Disp: , Rfl:    calcium citrate-vitamin D (CITRACAL+D) 315-200 MG-UNIT tablet, Take 1 tablet by mouth daily., Disp: , Rfl:    ipratropium (ATROVENT) 0.03 % nasal spray, Place 2 sprays into both nostrils every 12 (twelve) hours., Disp: 30 mL, Rfl: 3   loratadine (CLARITIN) 10 MG tablet, Take 10 mg by mouth daily., Disp: , Rfl:    OVER THE COUNTER MEDICATION, Take 3 capsules by mouth daily in the afternoon. Pure Incapsulation, Multi vit with vision supplement., Disp: , Rfl:    pravastatin (PRAVACHOL) 40 MG tablet, TAKE 1 TABLET BY MOUTH EVERY DAY, Disp: 90 tablet, Rfl: 1   Review of  Systems:   ROS Negative unless otherwise specified per HPI.  Vitals:   Vitals:   11/21/22 0806  BP: 122/80  Pulse: 77  Temp: 97.8 F (36.6 C)  TempSrc: Temporal  SpO2: 98%  Weight: 128 lb 6.1 oz (58.2 kg)  Height: 5' (1.524 m)     Body mass index is 25.07 kg/m.  Physical Exam:   Physical Exam Constitutional:      General: She is not in acute distress.    Appearance: Normal appearance. She is not ill-appearing.  HENT:     Head: Normocephalic and atraumatic.     Right Ear: External ear normal.     Left Ear: External ear normal.  Eyes:     Extraocular Movements: Extraocular movements intact.     Pupils: Pupils are equal, round, and reactive to light.  Cardiovascular:     Rate and Rhythm: Normal rate and regular rhythm.     Heart sounds: Normal heart sounds. No murmur heard.    No gallop.  Pulmonary:     Effort: Pulmonary effort is normal. No respiratory  distress.     Breath sounds: Normal breath sounds. No wheezing or rales.  Skin:    General: Skin is warm and dry.  Neurological:     Mental Status: She is alert and oriented to person, place, and time.  Psychiatric:        Judgment: Judgment normal.     Assessment and Plan:   Skin cancer screening Dermatology referral placed today  Localized osteoporosis without current pathological fracture Patient is interested in Reclast infusion I will place referral for osteoporosis clinic for patient to discuss this  I,Verona Buck,acting as a scribe for [employer], PA.,have documented all relevant documentation on the behalf of Jarold Motto, PA,as directed by  Jarold Motto, PA while in the presence of Jarold Motto, [place].  I, Jarold Motto, [place], have reviewed all documentation for this visit. The documentation on 11/21/22 for the exam, diagnosis, procedures, and orders are all accurate and complete.  Jarold Motto, PA-C

## 2022-11-24 DIAGNOSIS — H43812 Vitreous degeneration, left eye: Secondary | ICD-10-CM | POA: Diagnosis not present

## 2022-12-05 DIAGNOSIS — H353132 Nonexudative age-related macular degeneration, bilateral, intermediate dry stage: Secondary | ICD-10-CM | POA: Diagnosis not present

## 2022-12-08 ENCOUNTER — Encounter: Payer: Self-pay | Admitting: Physician Assistant

## 2022-12-08 ENCOUNTER — Ambulatory Visit: Payer: BC Managed Care – PPO | Admitting: Physician Assistant

## 2022-12-08 VITALS — Ht 60.5 in | Wt 130.0 lb

## 2022-12-08 DIAGNOSIS — M810 Age-related osteoporosis without current pathological fracture: Secondary | ICD-10-CM | POA: Diagnosis not present

## 2022-12-08 NOTE — Progress Notes (Signed)
Office Visit Note   Patient: Robin Stevens           Date of Birth: 12-25-1955           MRN: [id number] Visit Date: 12/08/2022              Requested by: Jarold Motto, [place] [address] [place],  [place] [address] PCP: Jarold Motto, [place]   Assessment & Plan: Visit Diagnoses:  1. Age-related osteoporosis without current pathological fracture      Plan: Spent 30 minutes face-to-face with patient discussing the disease management of osteoporosis for the reduction of future fractures I have explained that the bone strength is equal to the bone quality.  Over half the encounter was spent counseling patient on the disease of osteoporosis evidence-based best practice treatment options available and recommendations for improved bone quality.  We discussed the importance of calcium 1200 to 1500 mg daily vitamin D 2000 to 5000 units daily.  She actually has had a bone density test which demonstrates a T-score at lumbar spine of -2.9.  This classifies her as osteoporosis.  She does have some history of reflux.  She does have quite a bit of research on Reclast wondering if that is an option for her.  Based on her activity level and her answers to my questionnaire I think she would be better served starting out on Evenity.  She is extremely active and wants to have an active retirement.  She is high have been given her information about Evenity and Prolia.  She will research this and contact me.  If she wishes to go forward with Evenity will place preauthorization   Bone Health History  Fracture/Location wrist  Heart Disease or Stroke  No  Cancer No  Kidney Disease No  Ulcer No  Bypass Surgery No  Severe GERD No  History of seizures No  Age at Menopause early 50's  Hysterectomy  Calcium Intake 1200  Vitamin D intake2000Iu  HRT or history of using HRT no  Smoking  never  Alcohol Intake  Exercise/type 5-7  per week  See a dentist regularly  yes  Major Dental work  in las year none  Parents with hip/spine fracture No  Insurance status Commercial  Follow-Up Instructions: Will call  Orders:  No orders of the defined types were placed in this encounter.  No orders of the defined types were placed in this encounter.     Procedures: No procedures performed   Clinical Data: No additional findings.   Subjective: Chief Complaint  Patient presents with  . Osteoporosis    HPI patient was referred by her primary care provider and word of mouth to discuss osteoporosis treatment.  She is very active enjoys walking yoga free weights hiking and kayaking.  She did undergo a DEXA scan which demonstrated a T-score of 2.9 on the lumbar spine.  She had a history of a wrist fracture though it was nondisplaced required little treatment.  She has done quite a bit of research and wondering if Reclast would be appropriate for her as she does have some reflux  Review of Systems  All other systems reviewed and are negative.   Objective: Vital Signs: Ht 5' 0.5" (1.537 m)   Wt 130 lb (59 kg)   BMI 24.97 kg/m   Physical Exam Constitutional:      Appearance: Normal appearance.  Pulmonary:     Effort: Pulmonary effort is normal.  Neurological:     General: No  focal deficit present.     Mental Status: She is alert and oriented to person, place, and time.  Psychiatric:        Mood and Affect: Mood normal.   Ortho Exam  Specialty Comments:  No specialty comments available.  Imaging: No results found.   PMFS History: Patient Active Problem List   Diagnosis Date Noted  . Age-related osteoporosis without current pathological fracture 11/21/2022  . Dyslipidemia 09/03/2020  . Environmental and seasonal allergies 08/23/2018  . Vitamin D deficiency 08/17/2017  . Arthralgia of both hands 08/17/2017  . Macular degeneration of both eyes 03/10/2014   Past Medical History:  Diagnosis Date  . Allergy 2017   Seasonal started after relocate to [facility]  .  Arthritis    would like to confirm if I have arthritis  . Cataract   . 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  Marland Kitchen GERD (gastroesophageal reflux disease)   . History of chicken pox   . Localized osteoporosis without current pathological fracture 11/21/2022  . Macular degeneration of both eyes 2016    Family History  Problem Relation Age of Onset  . Arthritis Mother   . Heart attack Mother   . Heart disease Mother   . Obesity Mother   . Vision loss Mother   . Pancreatic cancer Father   . Cancer Father   . Early death Father   . Vision loss Brother   . Early death Maternal Grandmother 40       cardiac-related  . Heart disease Maternal Grandfather   . ADD / ADHD Daughter   . Anxiety disorder Daughter   . Anxiety disorder Son   . Vision loss Maternal Aunt   . Anxiety disorder Daughter     Past Surgical History:  Procedure Laterality Date  . EYE SURGERY  March 2022   Cataract both eyes  . HERNIA REPAIR Bilateral 1999   Inguinal   . removal of ovarian cyst Right 1995  . 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 VAGINAL DELIVERY  1985, 1987, 1989   Social History   Occupational History  . Not on file  Tobacco Use  . Smoking status: Never  . Smokeless tobacco: Never  Vaping Use  . Vaping status: Never Used  Substance and Sexual Activity  . Alcohol use: Yes    Alcohol/week: 5.0 standard drinks of alcohol    Types: 5 Glasses of wine per week    Comment: Glass of wine at dinner - less than 6 ounce pour  . Drug use: Never  . Sexual activity: Yes    Birth control/protection: Post-menopausal

## 2022-12-16 ENCOUNTER — Telehealth: Payer: Self-pay | Admitting: Physician Assistant

## 2022-12-16 NOTE — Telephone Encounter (Signed)
Patient called and talk to her about the decision she made. CB#[phone]

## 2022-12-17 ENCOUNTER — Telehealth: Payer: Self-pay | Admitting: Physician Assistant

## 2022-12-17 NOTE — Telephone Encounter (Signed)
Patient called. Says she would like to get the gel injection. Her work number is [phone]

## 2022-12-18 NOTE — Telephone Encounter (Signed)
Will submit once I receive a response from [place] with [employer] concerning Evinity.

## 2022-12-22 NOTE — Telephone Encounter (Signed)
VOB submitted for [employer] through [employer] portal

## 2022-12-24 ENCOUNTER — Telehealth: Payer: Self-pay | Admitting: Physician Assistant

## 2022-12-24 NOTE — Telephone Encounter (Signed)
Talked with patient and advised her that we are currently awaiting for her benefits to return and once approved she will receive a call to schedule.  Patient voiced that she understands.

## 2022-12-24 NOTE — Telephone Encounter (Signed)
Patient wanted to know if she got approved with her insurance to get the shot. CB#[phone] or [phone]

## 2022-12-30 ENCOUNTER — Encounter: Payer: Self-pay | Admitting: Physician Assistant

## 2022-12-30 DIAGNOSIS — M810 Age-related osteoporosis without current pathological fracture: Secondary | ICD-10-CM

## 2022-12-30 NOTE — Telephone Encounter (Signed)
Please see message and advise where to send Endo referral?

## 2023-01-04 ENCOUNTER — Other Ambulatory Visit: Payer: Self-pay | Admitting: Physician Assistant

## 2023-01-08 ENCOUNTER — Telehealth: Payer: Self-pay | Admitting: Physician Assistant

## 2023-01-08 NOTE — Telephone Encounter (Signed)
I called, LM for patient asking her to call me to discuss, told her we are working on the PA and getting her best benefits for meds with least OOP.

## 2023-01-08 NOTE — Telephone Encounter (Signed)
Patient called and said that she decided to go with someone else because it doesn't take 3 weeks to get insurance approved and nobody haven't called her for injection. CB#[phone]

## 2023-01-09 ENCOUNTER — Telehealth: Payer: Self-pay

## 2023-01-09 ENCOUNTER — Encounter: Payer: Self-pay | Admitting: Endocrinology

## 2023-01-09 ENCOUNTER — Ambulatory Visit: Payer: BC Managed Care – PPO | Admitting: Endocrinology

## 2023-01-09 VITALS — BP 132/70 | HR 69 | Resp 18 | Ht 61.0 in | Wt 131.2 lb

## 2023-01-09 DIAGNOSIS — M810 Age-related osteoporosis without current pathological fracture: Secondary | ICD-10-CM | POA: Diagnosis not present

## 2023-01-09 LAB — RENAL FUNCTION PANEL
Albumin: 4.3 g/dL (ref 3.5–5.2)
BUN: 18 mg/dL (ref 6–23)
CO2: 30 meq/L (ref 19–32)
Calcium: 9.6 mg/dL (ref 8.4–10.5)
Chloride: 104 meq/L (ref 96–112)
Creatinine, Ser: 0.83 mg/dL (ref 0.40–1.20)
GFR: 73.71 mL/min (ref >60.00)
Glucose, Bld: 88 mg/dL (ref 70–99)
Phosphorus: 4 mg/dL (ref 2.3–4.6)
Potassium: 4.4 meq/L (ref 3.5–5.1)
Sodium: 141 meq/L (ref 135–145)

## 2023-01-09 LAB — TSH: TSH: 1.24 u[IU]/mL (ref 0.35–5.50)

## 2023-01-09 LAB — T4, FREE: Free T4: 0.88 ng/dL (ref 0.60–1.60)

## 2023-01-09 NOTE — Telephone Encounter (Signed)
Dr. Erroll Luna, patient will be scheduled as soon as possible.  Auth Submission: NO AUTH NEEDED Site of care: Site of care: [facility] Payer: BCBS Medication & CPT/J Code(s) submitted: Reclast (Zolendronic acid) [id number] Route of submission (phone, fax, portal):  Phone # Fax # Auth type: Buy/Bill PB Units/visits requested: 5mg  x 1 dose Reference number:  Approval from: 01/09/23 to 03/10/23

## 2023-01-09 NOTE — Patient Instructions (Signed)
24-hr Urine Collection: - Preferably to be done while staying at home and start in the morning.  - Discard the first urine of the morning after you wake up because that was the urine from overnight. DO NOT COLLECT FIRST URINE. Then begin 24-hour urine collection.  Collect your urine every time you pee for next 24 hours which means all day and overnight until next morning.  You collect the last urine next morning, that COMPLETES the collection.    Zoledronic Acid Injection (Bone Disorders) What is this medication? ZOLEDRONIC ACID (ZOE le dron ik AS id) prevents and treats osteoporosis. It may also be used to treat Paget's disease of the bone. It works by Interior and spatial designer stronger and less likely to break (fracture). It belongs to a group of medications called bisphosphonates. This medicine may be used for other purposes; ask your health care provider or pharmacist if you have questions. COMMON BRAND NAME(S): Reclast What should I tell my care team before I take this medication? They need to know if you have any of these conditions: Bleeding disorder Cancer Dental disease Kidney disease Low levels of calcium in the blood Low red blood cell counts Lung or breathing disease, such as asthma Receiving steroids, such as dexamethasone or prednisone An unusual or allergic reaction to zoledronic acid, other medications, foods, dyes, or preservatives Pregnant or trying to get pregnant Breast-feeding How should I use this medication? This medication is injected into a vein. It is given by your care team in a hospital or clinic setting. A special MedGuide will be given to you before each treatment. Be sure to read this information carefully each time. Talk to your care team about the use of this medication in children. Special care may be needed. Overdosage: If you think you have taken too much of this medicine contact a poison control center or emergency room at once. NOTE: This medicine is only  for you. Do not share this medicine with others. What if I miss a dose? Keep appointments for follow-up doses. It is important not to miss your dose. Call your care team if you are unable to keep an appointment. What may interact with this medication? Certain antibiotics given by injection Medications for pain and inflammation, such as ibuprofen, naproxen, NSAIDs Some diuretics, such as bumetanide, furosemide Teriparatide This list may not describe all possible interactions. Give your health care provider a list of all the medicines, herbs, non-prescription drugs, or dietary supplements you use. Also tell them if you smoke, drink alcohol, or use illegal drugs. Some items may interact with your medicine. What should I watch for while using this medication? Visit your care team for regular checks on your progress. It may be some time before you see the benefit from this medication. Some people who take this medication have severe bone, joint, or muscle pain. This medication may also increase your risk for jaw problems or a broken thigh bone. Tell your care team right away if you have severe pain in your jaw, bones, joints, or muscles. Tell your care team if you have any pain that does not go away or that gets worse. You should make sure you get enough calcium and vitamin D while you are taking this medication. Discuss the foods you eat and the vitamins you take with your care team. You may need bloodwork while taking this medication. Tell your dentist and dental surgeon that you are taking this medication. You should not have major dental surgery while on  this medication. See your dentist to have a dental exam and fix any dental problems before starting this medication. Take good care of your teeth while on this medication. Make sure you see your dentist for regular follow-up appointments. What side effects may I notice from receiving this medication? Side effects that you should report to your care  team as soon as possible: Allergic reactions--skin rash, itching, hives, swelling of the face, lips, tongue, or throat Kidney injury--decrease in the amount of urine, swelling of the ankles, hands, or feet Low calcium level--muscle pain or cramps, confusion, tingling, or numbness in the hands or feet Osteonecrosis of the jaw--pain, swelling, or redness in the mouth, numbness of the jaw, poor healing after dental work, unusual discharge from the mouth, visible bones in the mouth Severe bone, joint, or muscle pain Side effects that usually do not require medical attention (report to your care team if they continue or are bothersome): Diarrhea Dizziness Headache Nausea Stomach pain Vomiting This list may not describe all possible side effects. Call your doctor for medical advice about side effects. You may report side effects to FDA at 1-800-FDA-1088. Where should I keep my medication? This medication is given in a hospital or clinic. It will not be stored at home. NOTE: This sheet is a summary. It may not cover all possible information. If you have questions about this medicine, talk to your doctor, pharmacist, or health care provider.  2024 Elsevier/Gold Standard (2021-04-12 00:00:00)

## 2023-01-09 NOTE — Progress Notes (Unsigned)
Outpatient Endocrinology Note [place] Hedaya Latendresse, MD  01/10/23  Patient's Name: KENNEDY BRINES    DOB: 19-Feb-1956    MRN: [id number]  REASON OF VISIT: New consult for evaluation of osteoporosis  REFERRING PROVIDER: Jarold Motto, PA  PCP: Jarold Motto, PA  HISTORY OF PRESENT ILLNESS:   ISLAND DOHMEN is a 67 y.o. old female with past medical history listed below, is here for evaluation  of bone health issues / osteoporosis.  Pertinent Bone Health History: Patient had DEXA scan for screening osteoporosis on October 07, 2022 with T-score of -2.9 at lumbar spine and -1.8 at femoral neck consistent with osteoporosis and reported neurology further evaluation and management of osteoporosis.  Patient denies any smoking, excessive alcohol he did, glucocorticoid use.  No fragility fracture.  No thyroid disorder.  No liver disease or malabsorption disorder.  No history of kidney stone.  She is included exercising with regular walking, weightbearing exercise, biking and kayaking.  No known family history of osteoporosis or hip fracture.  She had menopause at the age of mid 72s.  Calcium intake from supplements: 600 mg once a day.  Dietary calcium intake: cheese yogurt,  Current vitamin D intake: 1 tab 2000 international units daily.  Relevant comorbidities: No history of bone cancer. No history of external radiation treatment.   No history of diabetes mellitus.  No Rheumatoid arthritis. She has history of GERD, takes on and off antacid medication.  No history of recent invasive dental procedures. Not planning dental procedures in the near future.  Date of study: 10/07/2022 reviewed consistent with osteoporosis. Exam: DUAL X-RAY ABSORPTIOMETRY (DXA) FOR BONE MINERAL DENSITY (BMD) Instrument: [employer] Therapist, art Provider: PCP Indication: screening for osteoporosis Comparison:  none (please note that it is not possible to compare data from different instruments) Clinical data: Pt is a  67 y.o. female without history of fracture.    Results:   Lumbar spine L1-L4 Femoral neck (FN)  T-score   -2.9 RFN: -1.8 LFN: -1.8    Assessment: Patient has OSTEOPOROSIS according to the [facility] classification for osteoporosis (see below).  Interval history  Patient presented to establish endocrinology care for osteoporosis.  REVIEW OF SYSTEMS:  As per history of present illness.   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   GERD (gastroesophageal reflux disease)    History of chicken pox    Localized osteoporosis without current pathological fracture 11/21/2022   Macular degeneration of both eyes 2016    PAST SURGICAL HISTORY: Past Surgical History:  Procedure Laterality Date   EYE SURGERY  March 2022   Cataract both eyes   HERNIA REPAIR Bilateral 1999   Inguinal    removal of ovarian cyst Right 1995   THROAT SURGERY  2009   Zinkers Diverticulum   VAGINAL DELIVERY  1985, 1987, 1989    ALLERGIES: Allergies  Allergen Reactions   Sulfa Antibiotics Nausea Only    FAMILY HISTORY:  Family History  Problem Relation Age of Onset   Arthritis Mother    Heart attack Mother    Heart disease Mother    Obesity Mother    Vision loss Mother    Pancreatic cancer Father    Cancer Father    Early death Father  Vision loss Brother    Early death Maternal Grandmother 40       cardiac-related   Heart disease Maternal Grandfather    ADD / ADHD Daughter    Anxiety disorder Daughter    Anxiety disorder Son    Vision loss Maternal Aunt    Anxiety disorder Daughter     SOCIAL HISTORY: Social History   Socioeconomic History   Marital status: Married    Spouse name: Not on file   Number of children: Not on file   Years of education: Not on file    Highest education level: Associate degree: academic program  Occupational History   Not on file  Tobacco Use   Smoking status: Never   Smokeless tobacco: Never  Vaping Use   Vaping status: Never Used  Substance and Sexual Activity   Alcohol use: Yes    Alcohol/week: 5.0 standard drinks of alcohol    Types: 5 Glasses of wine per week    Comment: Glass of wine at dinner - less than 6 ounce pour   Drug use: Never   Sexual activity: Yes    Birth control/protection: Post-menopausal  Other Topics Concern   Not on file  Social History Narrative   From [place] here in Oct 2017 with husband   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, theatre/television/film   Social Determinants of Health   Financial Resource Strain: Low Risk  (11/17/2022)   Overall Financial Resource Strain (CARDIA)    Difficulty of Paying Living Expenses: Not hard at all  Food Insecurity: No Food Insecurity (11/17/2022)   Hunger Vital Sign    Worried About Running Out of Food in the Last Year: Never true    Ran Out of Food in the Last Year: Never true  Transportation Needs: No Transportation Needs (11/17/2022)   PRAPARE - Administrator, Civil Service (Medical): No    Lack of Transportation (Non-Medical): No  Physical Activity: Sufficiently Active (11/17/2022)   Exercise Vital Sign    Days of Exercise per Week: 5 days    Minutes of Exercise per Session: 50 min  Stress: No Stress Concern Present (11/17/2022)   [employer] of Occupational Health - Occupational Stress Questionnaire    Feeling of Stress : Only a little  Social Connections: Moderately Isolated (11/17/2022)   Social Connection and Isolation Panel [NHANES]    Frequency of Communication with Friends and Family: More than three times a week    Frequency of Social Gatherings with Friends and Family: Patient declined    Attends Religious Services: Never    Database administrator or Organizations: No    Attends Hospital doctor: Not on file    Marital Status: Married    MEDICATIONS:  Current Outpatient Medications  Medication Sig Dispense Refill   acetaminophen (TYLENOL 8 HOUR ARTHRITIS PAIN) 650 MG CR tablet Take 650 mg by mouth at bedtime.     b complex vitamins capsule Take 1 capsule by mouth daily.     calcium citrate-vitamin D (CITRACAL+D) 315-200 MG-UNIT tablet Take 1 tablet by mouth daily.     ipratropium (ATROVENT) 0.03 % nasal spray Place 2 sprays into both nostrils every 12 (twelve) hours. 30 mL 3   loratadine (CLARITIN) 10 MG tablet Take 10 mg by mouth daily.     OVER THE COUNTER MEDICATION Take 3 capsules by mouth daily in the afternoon. Pure Incapsulation, Multi vit with vision supplement.  pravastatin (PRAVACHOL) 40 MG tablet TAKE 1 TABLET BY MOUTH EVERY DAY 90 tablet 1   No current facility-administered medications for this visit.    PHYSICAL EXAM: Vitals:   01/09/23 0805  BP: 132/70  Pulse: 69  Resp: 18  SpO2: 98%  Weight: 131 lb 3.2 oz (59.5 kg)  Height: 5\' 1"  (1.549 m)   Body mass index is 24.79 kg/m.  Wt Readings from Last 3 Encounters:  01/09/23 131 lb 3.2 oz (59.5 kg)  12/08/22 130 lb (59 kg)  11/21/22 128 lb 6.1 oz (58.2 kg)    General: Well developed, well nourished female in no apparent distress.  HEENT: AT/[place], no external lesions. Hearing intact to the spoken word Eyes: Conjunctiva clear and no icterus. Neck: Trachea midline, neck supple without appreciable thyromegaly or lymphadenopathy and no palpable thyroid nodules Lungs: Clear to auscultation, no wheeze. Respirations not labored Heart: S1S2, Regular in rate and rhythm.  Abdomen: Soft, non tender, non distended Neurologic: Alert, oriented, normal speech, deep tendon biceps reflexes normal,  no gross focal neurological deficit Extremities: No pedal pitting edema, no tremors of outstretched hands. No spine tenderness Skin: Warm, color good.  Psychiatric: Does not appear depressed or anxious  PERTINENT  HISTORIC LABORATORY AND IMAGING STUDIES:  All pertinent laboratory results were reviewed. Please see HPI also for further details.   Lab Results  Component Value Date   ALKPHOS 60 09/09/2022   ALKPHOS 56 09/04/2021   ALKPHOS 56 09/03/2020     ASSESSMENT / PLAN  1. Age-related osteoporosis without current pathological fracture     Ms. Upham is a 67 y.o. old female with osteoporosis based on DXA scan results and no history of fragility fractures. Patient has risk factor for osteoporosis, including age, postmenopausal status.  No other identifiable risk factors. -DEXA scan in July 2024 with lowest T-score of -2.9 at lumbar spine consistent with osteoporosis.  The patient also has a history of GERD. In this context my recommendation is to treat with Reclast annual injections. I reviewed with the patient the potential complications of this treatment, including the rare but serious osteonecrosis of the jaw and atypical bone fractures. We also talk about the possibility of influenza-like symptoms after the infusion. The plan will be to treat with Reclast 5 mg IV once a year for at least 3 years.   -Increase calcium 600 mg 2 times a day.  Requires at least 1200 calcium daily, between diet and supplements and current vitamin D supplement.  -Continue regular weight bearing exercises and fall precaution discussed. -I would like to check PTH, renal function panel, thyroid function test and 24-hour urine calcium and creatinine to secondary workup for osteoporosis. -Start Reclast 5 mg IV now and annually.  Diagnoses and all orders for this visit:  Age-related osteoporosis without current pathological fracture -     Renal function panel; Future -     Parathyroid hormone, intact (no Ca); Future -     Calcium, 24-Hour Urine with Creatinine; Future -     T4, free; Future -     TSH; Future -     TSH -     T4, free -     Parathyroid hormone, intact (no Ca) -     Renal function panel  Other  orders -     0.9 %  sodium chloride infusion -     alteplase (CATHFLO ACTIVASE) injection 2 mg -     heparin lock flush 100 unit/mL -  sodium chloride flush (NS) 0.9 % injection 10 mL -     sodium chloride flush (NS) 0.9 % injection 3 mL -     anticoagulant sodium citrate solution 5 mL -     heparin lock flush 100 unit/mL -     acetaminophen (TYLENOL) tablet 650 mg -     diphenhydrAMINE (BENADRYL) capsule 25 mg -     zoledronic acid (RECLAST) injection 5 mg -     famotidine (PEPCID) 20 mg in sodium chloride 0.9 % 50 mL IVPB -     0.9 %  sodium chloride infusion -     methylPREDNISolone sodium succinate (SOLU-MEDROL) 125 mg/2 mL injection 125 mg -     diphenhydrAMINE (BENADRYL) injection 50 mg -     albuterol (VENTOLIN HFA) 108 (90 Base) MCG/ACT inhaler 2 puff -     EPINEPHrine (EPI-PEN) injection 0.3 mg    DISPOSITION Follow up in clinic in 6 months suggested.  All questions answered and patient verbalized understanding of the plan.  [place] Luberta Grabinski, MD [facility] [address] [place], [address] [place], [place] [address] Phone # [phone]  At least part of this note was generated using voice recognition software. Inadvertent word errors may have occurred, which were not recognized during the proofreading process.

## 2023-01-10 ENCOUNTER — Encounter: Payer: Self-pay | Admitting: Endocrinology

## 2023-01-10 LAB — PARATHYROID HORMONE, INTACT (NO CA): PTH: 38 pg/mL (ref 16–77)

## 2023-01-12 ENCOUNTER — Other Ambulatory Visit: Payer: BC Managed Care – PPO

## 2023-01-12 ENCOUNTER — Telehealth: Payer: Self-pay

## 2023-01-12 DIAGNOSIS — M810 Age-related osteoporosis without current pathological fracture: Secondary | ICD-10-CM

## 2023-01-12 NOTE — Telephone Encounter (Signed)
Faxed completed [place] to [employer] for Evenity to [phone]. Pre-Determination pending

## 2023-01-13 LAB — CALCIUM, 24-HOUR URINE WITH CREATININE
CALCIUM/CREATININE RATIO: 187 mg/g{creat} (ref 30–275)
Calcium, 24H Urine: 117 mg/(24.h)
Creatinine, 24H Ur: 0.62 g/(24.h) (ref 0.50–2.15)

## 2023-01-20 ENCOUNTER — Telehealth: Payer: Self-pay

## 2023-01-20 NOTE — Telephone Encounter (Signed)
Per [place], [place] is pending for [id number] under request number [id number]

## 2023-01-22 ENCOUNTER — Other Ambulatory Visit: Payer: Self-pay | Admitting: Medical Genetics

## 2023-01-22 DIAGNOSIS — Z006 Encounter for examination for normal comparison and control in clinical research program: Secondary | ICD-10-CM

## 2023-01-30 ENCOUNTER — Ambulatory Visit: Payer: BC Managed Care – PPO

## 2023-01-30 VITALS — BP 178/93 | HR 63 | Temp 98.9°F | Resp 18 | Ht 60.0 in | Wt 134.6 lb

## 2023-01-30 DIAGNOSIS — M810 Age-related osteoporosis without current pathological fracture: Secondary | ICD-10-CM

## 2023-01-30 MED ORDER — SODIUM CHLORIDE 0.9 % IV SOLN: INTRAVENOUS | Status: DC

## 2023-01-30 MED ORDER — ZOLEDRONIC ACID 5 MG/100ML IV SOLN
5.0000 mg | Freq: Once | INTRAVENOUS | Status: AC
  Administered 2023-01-30: 5 mg via INTRAVENOUS
  Filled 2023-01-30: qty 100

## 2023-01-30 MED ORDER — DIPHENHYDRAMINE HCL 25 MG PO CAPS
25.0000 mg | ORAL_CAPSULE | Freq: Once | ORAL | Status: AC
  Administered 2023-01-30: 25 mg via ORAL
  Filled 2023-01-30: qty 1

## 2023-01-30 MED ORDER — ACETAMINOPHEN 325 MG PO TABS
650.0000 mg | ORAL_TABLET | Freq: Once | ORAL | Status: AC
Start: 2023-01-30 — End: 2023-01-30
  Administered 2023-01-30: 650 mg via ORAL
  Filled 2023-01-30: qty 2

## 2023-01-30 NOTE — Patient Instructions (Signed)

## 2023-01-30 NOTE — Progress Notes (Signed)
Diagnosis: Osteoporosis  Provider:  Chilton Greathouse MD  Procedure: IV Infusion  IV Type: Peripheral, IV Location: L Antecubital  Reclast (Zolendronic Acid), Dose: 5 mg  Infusion Start Time: 1458  Infusion Stop Time: 1531  Post Infusion IV Care: Observation period completed and Peripheral IV Discontinued  Discharge: Condition: Good, Destination: Home . AVS Provided  Performed by:  Adriana Mccallum, RN

## 2023-02-23 ENCOUNTER — Other Ambulatory Visit (HOSPITAL_COMMUNITY)
Admission: RE | Admit: 2023-02-23 | Discharge: 2023-02-23 | Disposition: A | Discharge status: Home / Self Care | Payer: Self-pay | Source: Ambulatory Visit | Attending: Medical Genetics | Admitting: Medical Genetics

## 2023-02-23 DIAGNOSIS — Z006 Encounter for examination for normal comparison and control in clinical research program: Secondary | ICD-10-CM

## 2023-03-09 LAB — GENECONNECT MOLECULAR SCREEN: Genetic Analysis Overall Interpretation: NEGATIVE

## 2023-04-14 ENCOUNTER — Other Ambulatory Visit: Payer: Self-pay | Admitting: Physician Assistant

## 2023-04-14 DIAGNOSIS — Z0000 Encounter for general adult medical examination without abnormal findings: Secondary | ICD-10-CM

## 2023-04-21 ENCOUNTER — Ambulatory Visit: Payer: BC Managed Care – PPO | Admitting: Physician Assistant

## 2023-04-21 ENCOUNTER — Encounter: Payer: Self-pay | Admitting: Physician Assistant

## 2023-04-21 VITALS — BP 180/76 | HR 68 | Temp 97.7°F | Ht 60.0 in | Wt 135.0 lb

## 2023-04-21 DIAGNOSIS — H9201 Otalgia, right ear: Secondary | ICD-10-CM

## 2023-04-21 DIAGNOSIS — I10 Essential (primary) hypertension: Secondary | ICD-10-CM

## 2023-04-21 MED ORDER — OFLOXACIN 0.3 % OT SOLN: 5.0000 [drp] | Freq: Every day | OTIC | 0 refills | Status: DC

## 2023-04-21 MED ORDER — AMLODIPINE BESYLATE 5 MG PO TABS: 5.0000 mg | ORAL_TABLET | Freq: Every day | ORAL | 1 refills | Status: DC

## 2023-04-21 NOTE — Progress Notes (Signed)
Robin Stevens is a 68 y.o. female here for a new problem.  History of Present Illness:   Chief Complaint  Patient presents with   Ear Problem    Pt c/o right ear blocked off and on x 2 weeks.    HPI  Ear Pain  She complains today of right ear blockage that has persisted for the past 2 weeks.  Does report some accompanying chest congestion.  She has been taking zyrtec once daily. Has not used Atrovent for the past year. She reports having a similar episode of this that resolved with time.  Denies use of any medications use, pain, head congestion, or recent travel. Does report occasional use of ear pods  Elevated BP  BP is elevated today. 168/84. She reports noticing constant elevated readings over the past 2 months.  She is agreeable to starting medications at this time.  Reports having elevated readings few years ago but attributes that to stress during that time.  Does report taking 2 tylenol arthritis before going to sleep.  Currently not on any medications.  Patient denies chest pain, SOB, blurred vision, dizziness, unusual headaches, lower leg swelling. Patient is compliant with medication. 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   GERD (gastroesophageal reflux disease)    History of chicken pox    Localized osteoporosis without current pathological fracture 11/21/2022   Macular degeneration of both eyes 2016     Social History   Tobacco Use   Smoking status: Never   Smokeless tobacco: Never  Vaping Use   Vaping status: Never Used  Substance Use Topics   Alcohol use: Yes    Alcohol/week: 5.0 standard drinks of alcohol    Types: 5 Glasses of wine per week     Comment: Glass of wine at dinner - less than 6 ounce pour   Drug use: Never    Past Surgical History:  Procedure Laterality Date   EYE SURGERY  March 2022   Cataract both eyes   HERNIA REPAIR Bilateral 1999   Inguinal    removal of ovarian cyst Right 1995   THROAT SURGERY  2009   Zinkers Diverticulum   VAGINAL DELIVERY  1985, 1987, 1989    Family History  Problem Relation Age of Onset   Arthritis Mother    Heart attack Mother    Heart disease Mother    Obesity Mother    Vision loss Mother    Pancreatic cancer Father    Cancer Father    Early death Father    Vision loss Brother    Early death Maternal Grandmother 40       cardiac-related   Heart disease Maternal Grandfather    ADD / ADHD Daughter    Anxiety disorder Daughter    Anxiety disorder Son    Vision loss Maternal Aunt    Anxiety disorder Daughter     Allergies  Allergen Reactions   Sulfa Antibiotics Nausea Only    Current Medications:   Current Outpatient Medications:    acetaminophen (TYLENOL 8 HOUR ARTHRITIS PAIN) 650 MG CR tablet, Take 650 mg by mouth at bedtime., Disp: , Rfl:    amLODipine (  NORVASC) 5 MG tablet, Take 1 tablet (5 mg total) by mouth daily., Disp: 30 tablet, Rfl: 1   calcium citrate-vitamin D (CITRACAL+D) 315-200 MG-UNIT tablet, Take 1 tablet by mouth daily., Disp: , Rfl:    ipratropium (ATROVENT) 0.03 % nasal spray, Place 2 sprays into both nostrils every 12 (twelve) hours., Disp: 30 mL, Rfl: 3   loratadine (CLARITIN) 10 MG tablet, Take 10 mg by mouth daily., Disp: , Rfl:    ofloxacin (FLOXIN) 0.3 % OTIC solution, Place 5 drops into the right ear daily., Disp: 5 mL, Rfl: 0   OVER THE COUNTER MEDICATION, Take 3 capsules by mouth daily in the afternoon. Pure Incapsulation, Multi vit with vision supplement., Disp: , Rfl:    pravastatin (PRAVACHOL) 40 MG tablet, TAKE 1 TABLET BY MOUTH EVERY DAY, Disp: 90 tablet, Rfl: 1   Review of Systems:   Review of Systems  HENT:  Positive for  congestion and ear pain.   Neurological:  Negative for headaches.    Vitals:   Vitals:   04/21/23 1421 04/21/23 1500  BP: (!) 168/84 (!) 180/76  Pulse: 68   Temp: 97.7 F (36.5 C)   TempSrc: Temporal   SpO2: 98%   Weight: 135 lb (61.2 kg)   Height: 5' (1.524 m)      Body mass index is 26.37 kg/m.  Physical Exam:   Physical Exam Vitals and nursing note reviewed.  Constitutional:      General: She is not in acute distress.    Appearance: She is well-developed. She is not ill-appearing or toxic-appearing.  HENT:     Head: Normocephalic and atraumatic.     Right Ear: Tympanic membrane, ear canal and external ear normal. There is impacted cerumen.     Left Ear: Tympanic membrane, ear canal and external ear normal. Tympanic membrane is not erythematous, retracted or bulging.     Nose: Nose normal.     Right Sinus: No maxillary sinus tenderness or frontal sinus tenderness.     Left Sinus: No maxillary sinus tenderness or frontal sinus tenderness.     Mouth/Throat:     Pharynx: Uvula midline. No posterior oropharyngeal erythema.  Eyes:     General: Lids are normal.     Conjunctiva/sclera: Conjunctivae normal.  Neck:     Trachea: Trachea normal.  Cardiovascular:     Rate and Rhythm: Normal rate and regular rhythm.     Heart sounds: Normal heart sounds, S1 normal and S2 normal.  Pulmonary:     Effort: Pulmonary effort is normal.     Breath sounds: Normal breath sounds. No decreased breath sounds, wheezing, rhonchi or rales.  Lymphadenopathy:     Cervical: No cervical adenopathy.  Skin:    General: Skin is warm and dry.  Neurological:     Mental Status: She is alert.  Psychiatric:        Speech: Speech normal.        Behavior: Behavior normal. Behavior is cooperative.    Ceruminosis is noted.  Wax is removed by manual debridement. Slight bright red bleeding in middle ear canal without purulent discharge or significant pain.  Assessment and Plan:   Primary  hypertension Above goal today No evidence of end-organ damage on my exam Recommend patient monitor home blood pressure at least a few times weekly Start amlodipine 5 mg daily -- discussed risks and benefits/side effect(s)  If home monitoring shows consistent elevation, or any symptom(s) develop, recommend reach out to [place] for further advice on  next steps Follow-up via MyChart in a few weeks to let me know how numbers/medications are doing  Right ear pain Had some relief of symptoms with wax removal however could not complete task due to slight bleeding Advised her to use a few drops of saline to rinse out ear prior to showering for the next few days Will also trial ofloxacin drops per prescription to help with any infection Close follow-up if any new/worsening symptom(s)    Jarold Motto, PA-C  Ladona Mow M Kadhim,acting as a scribe for Jarold Motto, PA.,have documented all relevant documentation on the behalf of Jarold Motto, PA,as directed by  Jarold Motto, PA while in the presence of Jarold Motto, [place].   I, Jarold Motto, [place], have reviewed all documentation for this visit. The documentation on 04/21/23 for the exam, diagnosis, procedures, and orders are all accurate and complete.

## 2023-04-29 ENCOUNTER — Encounter: Payer: Self-pay | Admitting: Physician Assistant

## 2023-05-11 ENCOUNTER — Encounter: Payer: Self-pay | Admitting: Physician Assistant

## 2023-05-11 NOTE — Telephone Encounter (Signed)
 Noted.

## 2023-05-14 ENCOUNTER — Other Ambulatory Visit: Payer: Self-pay | Admitting: Physician Assistant

## 2023-05-28 ENCOUNTER — Ambulatory Visit
Admission: RE | Admit: 2023-05-28 | Discharge: 2023-05-28 | Disposition: A | Discharge status: Home / Self Care | Payer: BC Managed Care – PPO | Source: Ambulatory Visit | Attending: Physician Assistant | Admitting: Physician Assistant

## 2023-05-28 DIAGNOSIS — Z0000 Encounter for general adult medical examination without abnormal findings: Secondary | ICD-10-CM

## 2023-05-28 DIAGNOSIS — Z1231 Encounter for screening mammogram for malignant neoplasm of breast: Secondary | ICD-10-CM | POA: Diagnosis not present

## 2023-06-09 ENCOUNTER — Other Ambulatory Visit (HOSPITAL_COMMUNITY): Payer: Self-pay

## 2023-06-09 ENCOUNTER — Encounter: Payer: Self-pay | Admitting: Physician Assistant

## 2023-06-09 MED ORDER — PRAVASTATIN SODIUM 40 MG PO TABS
40.0000 mg | ORAL_TABLET | Freq: Every day | ORAL | 1 refills | Status: DC
  Filled 2023-06-09: qty 90, 90d supply, fill #0

## 2023-06-09 MED ORDER — AMLODIPINE BESYLATE 10 MG PO TABS
10.0000 mg | ORAL_TABLET | Freq: Every day | ORAL | 1 refills | Status: DC
  Filled 2023-06-09: qty 90, 90d supply, fill #0
  Filled 2023-12-01: qty 90, 90d supply, fill #1

## 2023-06-09 NOTE — Addendum Note (Signed)
 Addended by: Jimmye Norman on: 06/09/2023 01:36 PM   Modules accepted: Orders

## 2023-06-30 ENCOUNTER — Other Ambulatory Visit: Payer: Self-pay | Admitting: Physician Assistant

## 2023-07-09 ENCOUNTER — Ambulatory Visit: Payer: BC Managed Care – PPO | Admitting: Endocrinology

## 2023-07-14 ENCOUNTER — Ambulatory Visit: Payer: BC Managed Care – PPO | Admitting: Endocrinology

## 2023-07-14 ENCOUNTER — Encounter: Payer: Self-pay | Admitting: Endocrinology

## 2023-07-14 VITALS — BP 112/60 | HR 68 | Resp 20 | Ht 62.0 in | Wt 132.8 lb

## 2023-07-14 DIAGNOSIS — M810 Age-related osteoporosis without current pathological fracture: Secondary | ICD-10-CM | POA: Diagnosis not present

## 2023-07-14 NOTE — Progress Notes (Signed)
 Outpatient Endocrinology Note Robin Florence Antonelli, MD  07/14/23  Patient's Name: Robin Stevens    DOB: 07-17-1955    MRN: [id number]  REASON OF VISIT: Follow-up for osteoporosis  REFERRING PROVIDER: Alexander Iba, PA  PCP: Alexander Iba, PA  HISTORY OF PRESENT ILLNESS:   Robin Stevens is a 68 y.o. old female with past medical history listed below, is here for follow-up of bone health issues / osteoporosis.  Pertinent Bone Health History: Patient had DEXA scan for screening osteoporosis on October 07, 2022 with T-score of -2.9 at lumbar spine and -1.8 at femoral neck consistent with osteoporosis and reported neurology further evaluation and management of osteoporosis.  Patient denies any smoking, excessive alcohol he did, glucocorticoid use.  No fragility fracture.  No thyroid  disorder.  No liver disease or malabsorption disorder.  No history of kidney stone.  She is included exercising with regular walking, weightbearing exercise, biking and kayaking.  No known family history of osteoporosis or hip fracture.  She had menopause at the age of mid 82s.  Secondary workup for osteoporosis negative with normal 24-hour urine calcium, normal parathyroid  hormone.  Started on Reclast , first dose in November 2024.  Calcium intake from supplements: 600 mg 2 times a day.  Dietary calcium intake: cheese yogurt,  Current vitamin D  intake: 1 tab 2000 international units daily.  Relevant comorbidities: No history of bone cancer. No history of external radiation treatment.   No history of diabetes mellitus.  No Rheumatoid arthritis. She has history of GERD, takes on and off antacid medication.  No history of recent invasive dental procedures. Not planning dental procedures in the near future.  Date of study: 10/07/2022 reviewed consistent with osteoporosis. Exam: DUAL X-RAY ABSORPTIOMETRY (DXA) FOR BONE MINERAL DENSITY (BMD) Instrument: [employer] Therapist, art Provider: PCP Indication:  screening for osteoporosis Comparison:  none (please note that it is not possible to compare data from different instruments) Clinical data: Pt is a 68 y.o. female without history of fracture.    Results:   Lumbar spine L1-L4 Femoral neck (FN)  T-score   -2.9 RFN: -1.8 LFN: -1.8    Assessment: Patient has OSTEOPOROSIS according to the [facility] classification for osteoporosis (see below).  Interval history  Patient received Reclast  5 mg IV in November 2024.  Patient had couple of days of musclache  as expected otherwise no concern regarding side effects.  No fall and fracture.  She has been taking calcium and vitamin D  supplement.  She has been taking calcium/vitamin D  600-202 times a day and vitamin D3 2000 international unit daily.  She has been doing weightbearing exercise and also physical activity regularly.  No other complaints today.  REVIEW OF SYSTEMS:  As per history of present illness.   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   GERD (gastroesophageal reflux disease)    History of chicken pox    Localized osteoporosis without current pathological fracture 11/21/2022   Macular degeneration of both eyes 2016    PAST SURGICAL HISTORY: Past Surgical History:  Procedure Laterality Date   BREAST BIOPSY Right 06/21/2021   EYE SURGERY  March 2022   Cataract both eyes   HERNIA REPAIR Bilateral 1999   Inguinal    removal of  ovarian cyst Right 1995   THROAT SURGERY  2009   Zinkers Diverticulum   VAGINAL DELIVERY  1985, 1987, 1989    ALLERGIES: Allergies  Allergen Reactions   Sulfa Antibiotics Nausea Only    FAMILY HISTORY:  Family History  Problem Relation Age of Onset   Arthritis Mother    Heart attack Mother    Heart disease Mother    Obesity  Mother    Vision loss Mother    Pancreatic cancer Father    Cancer Father    Early death Father    Vision loss Brother    Early death Maternal Grandmother 40       cardiac-related   Heart disease Maternal Grandfather    ADD / ADHD Daughter    Anxiety disorder Daughter    Anxiety disorder Son    Vision loss Maternal Aunt    Anxiety disorder Daughter     SOCIAL HISTORY: Social History   Socioeconomic History   Marital status: Married    Spouse name: Not on file   Number of children: Not on file   Years of education: Not on file   Highest education level: Associate degree: occupational, Scientist, product/process development, or vocational program  Occupational History   Not on file  Tobacco Use   Smoking status: Never   Smokeless tobacco: Never  Vaping Use   Vaping status: Never Used  Substance and Sexual Activity   Alcohol use: Yes    Alcohol/week: 5.0 standard drinks of alcohol    Types: 5 Glasses of wine per week    Comment: Glass of wine at dinner - less than 6 ounce pour   Drug use: Never   Sexual activity: Yes    Birth control/protection: Post-menopausal  Other Topics Concern   Not on file  Social History Narrative   From [place]    Moved here in Oct 2017 with husband   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, theatre/television/film   Social Drivers of Health   Financial Resource Strain: Low Risk  (04/18/2023)   Overall Financial Resource Strain (CARDIA)    Difficulty of Paying Living Expenses: Not hard at all  Food Insecurity: No Food Insecurity (04/18/2023)   Hunger Vital Sign    Worried About Running Out of Food in the Last Year: Never true    Ran Out of Food in the Last Year: Never true  Transportation Needs: No Transportation Needs (04/18/2023)   PRAPARE - Administrator, Civil Service (Medical): No    Lack of Transportation (Non-Medical): No  Physical Activity: Sufficiently Active (04/18/2023)   Exercise Vital Sign    Days of Exercise per Week: 6 days    Minutes of  Exercise per Session: 30 min  Stress: Stress Concern Present (04/18/2023)   [employer] of Occupational Health - Occupational Stress Questionnaire    Feeling of Stress : To some extent  Social Connections: Moderately Isolated (04/18/2023)   Social Connection and Isolation Panel [NHANES]    Frequency of Communication with Friends and Family: Three times a week    Frequency of Social Gatherings with Friends and Family: Patient declined    Attends Religious Services: Never    Database administrator or Organizations: No    Attends Engineer, structural: Not on file    Marital Status: Married    MEDICATIONS:  Current Outpatient Medications  Medication Sig Dispense Refill   acetaminophen  (TYLENOL  8 HOUR ARTHRITIS PAIN) 650 MG CR tablet Take 650  mg by mouth at bedtime.     amLODipine  (NORVASC ) 10 MG tablet Take 1 tablet (10 mg total) by mouth daily. 90 tablet 1   calcium citrate-vitamin D  (CITRACAL+D) 315-200 MG-UNIT tablet Take 1 tablet by mouth daily.     ipratropium (ATROVENT ) 0.03 % nasal spray Place 2 sprays into both nostrils every 12 (twelve) hours. 30 mL 3   loratadine (CLARITIN) 10 MG tablet Take 10 mg by mouth daily.     OVER THE COUNTER MEDICATION Take 3 capsules by mouth daily in the afternoon. Pure Incapsulation, Multi vit with vision supplement.     pravastatin  (PRAVACHOL ) 40 MG tablet TAKE 1 TABLET BY MOUTH EVERY DAY 90 tablet 1   No current facility-administered medications for this visit.    PHYSICAL EXAM: Vitals:   07/14/23 0852  BP: 112/60  Pulse: 68  Resp: 20  SpO2: 98%  Weight: 132 lb 12.8 oz (60.2 kg)  Height: 5\' 2"  (1.575 m)   Body mass index is 24.29 kg/m.  Wt Readings from Last 3 Encounters:  07/14/23 132 lb 12.8 oz (60.2 kg)  04/21/23 135 lb (61.2 kg)  01/30/23 134 lb 9.6 oz (61.1 kg)    General: Well developed, well nourished female in no apparent distress.  HEENT: AT/[place], no external lesions. Hearing intact to the spoken word Eyes:  Conjunctiva clear and no icterus. Neck: Trachea midline, neck supple without appreciable thyromegaly or lymphadenopathy and no palpable thyroid  nodules Lungs: Clear to auscultation, no wheeze. Respirations not labored Heart: S1S2, Regular in rate and rhythm.  Abdomen: Soft, non tender, non distended Neurologic: Alert, oriented, normal speech, deep tendon biceps reflexes normal,  no gross focal neurological deficit Extremities: No pedal pitting edema, no tremors of outstretched hands. No spine tenderness Skin: Warm, color good.  Psychiatric: Does not appear depressed or anxious  PERTINENT HISTORIC LABORATORY AND IMAGING STUDIES:  All pertinent laboratory results were reviewed. Please see HPI also for further details.   Lab Results  Component Value Date   ALKPHOS 60 09/09/2022   ALKPHOS 56 09/04/2021   ALKPHOS 56 09/03/2020     ASSESSMENT / PLAN  1. Age-related osteoporosis without current pathological fracture    Robin Stevens is a 68 y.o. old female with osteoporosis based on DXA scan results and no history of fragility fractures. Patient has risk factor for osteoporosis, including age, postmenopausal status.  No other identifiable risk factors. -DEXA scan in July 2024 with lowest T-score of -2.9 at lumbar spine consistent with osteoporosis. -Reclast  started, first dose was in November 2024.  Plan: - Continue Reclast  5 mg IV infusion, next dose in November 2025.  Will likely continue annually at least for 3 years and evaluate for drug holiday. - Continue current dose of calcium/vitamin D  600-402 times a day along with vitamin D3 2000 international unit daily. -Continue regular weight bearing exercises and fall precaution discussed. - Repeat DEXA scan in Fall of  2026. - Annual endocrinology follow-up.  Diagnoses and all orders for this visit:  Age-related osteoporosis without current pathological fracture     DISPOSITION Follow up in clinic in 12 months suggested.  All  questions answered and patient verbalized understanding of the plan.  Robin Deondrea Aguado, MD [facility] Endocrinology [facility] [address], [address] [place], [place] [address] Phone # [phone]  At least part of this note was generated using voice recognition software. Inadvertent word errors may have occurred, which were not recognized during the proofreading process.

## 2023-09-23 ENCOUNTER — Ambulatory Visit: Admitting: Physician Assistant

## 2023-09-23 ENCOUNTER — Ambulatory Visit: Payer: Self-pay | Admitting: Physician Assistant

## 2023-09-23 ENCOUNTER — Encounter: Payer: Self-pay | Admitting: Physician Assistant

## 2023-09-23 VITALS — BP 122/80 | HR 64 | Temp 98.2°F | Ht 62.0 in | Wt 130.0 lb

## 2023-09-23 DIAGNOSIS — H9193 Unspecified hearing loss, bilateral: Secondary | ICD-10-CM | POA: Diagnosis not present

## 2023-09-23 DIAGNOSIS — Z0000 Encounter for general adult medical examination without abnormal findings: Secondary | ICD-10-CM

## 2023-09-23 DIAGNOSIS — I10 Essential (primary) hypertension: Secondary | ICD-10-CM

## 2023-09-23 DIAGNOSIS — M810 Age-related osteoporosis without current pathological fracture: Secondary | ICD-10-CM | POA: Diagnosis not present

## 2023-09-23 DIAGNOSIS — L219 Seborrheic dermatitis, unspecified: Secondary | ICD-10-CM | POA: Insufficient documentation

## 2023-09-23 DIAGNOSIS — E785 Hyperlipidemia, unspecified: Secondary | ICD-10-CM

## 2023-09-23 DIAGNOSIS — R5381 Other malaise: Secondary | ICD-10-CM

## 2023-09-23 DIAGNOSIS — Z0101 Encounter for examination of eyes and vision with abnormal findings: Secondary | ICD-10-CM

## 2023-09-23 DIAGNOSIS — Z1283 Encounter for screening for malignant neoplasm of skin: Secondary | ICD-10-CM

## 2023-09-23 LAB — COMPREHENSIVE METABOLIC PANEL WITH GFR
ALT: 12 U/L (ref 0–35)
AST: 21 U/L (ref 0–37)
Albumin: 4.4 g/dL (ref 3.5–5.2)
Alkaline Phosphatase: 42 U/L (ref 39–117)
BUN: 19 mg/dL (ref 6–23)
CO2: 31 meq/L (ref 19–32)
Calcium: 9.3 mg/dL (ref 8.4–10.5)
Chloride: 103 meq/L (ref 96–112)
Creatinine, Ser: 0.82 mg/dL (ref 0.40–1.20)
GFR: 74.43 mL/min (ref >60.00)
Glucose, Bld: 83 mg/dL (ref 70–99)
Potassium: 4.2 meq/L (ref 3.5–5.1)
Sodium: 141 meq/L (ref 135–145)
Total Bilirubin: 0.5 mg/dL (ref 0.2–1.2)
Total Protein: 7.1 g/dL (ref 6.0–8.3)

## 2023-09-23 LAB — CBC WITH DIFFERENTIAL/PLATELET
Basophils Absolute: 0 K/uL (ref 0.0–0.1)
Basophils Relative: 0.5 % (ref 0.0–3.0)
Eosinophils Absolute: 0.1 K/uL (ref 0.0–0.7)
Eosinophils Relative: 1.2 % (ref 0.0–5.0)
HCT: 40.4 % (ref 36.0–46.0)
Hemoglobin: 13.2 g/dL (ref 12.0–15.0)
Lymphocytes Relative: 21 % (ref 12.0–46.0)
Lymphs Abs: 1.6 K/uL (ref 0.7–4.0)
MCHC: 32.6 g/dL (ref 30.0–36.0)
MCV: 89.8 fl (ref 78.0–100.0)
Monocytes Absolute: 0.8 K/uL (ref 0.1–1.0)
Monocytes Relative: 9.9 % (ref 3.0–12.0)
Neutro Abs: 5.2 K/uL (ref 1.4–7.7)
Neutrophils Relative %: 67.4 % (ref 43.0–77.0)
Platelets: 245 K/uL (ref 150.0–400.0)
RBC: 4.5 Mil/uL (ref 3.87–5.11)
RDW: 14.5 % (ref 11.5–15.5)
WBC: 7.8 K/uL (ref 4.0–10.5)

## 2023-09-23 LAB — LIPID PANEL
Cholesterol: 189 mg/dL (ref 0–200)
HDL: 87.8 mg/dL (ref >39.00)
LDL Cholesterol: 83 mg/dL (ref 0–99)
NonHDL: 100.72
Total CHOL/HDL Ratio: 2
Triglycerides: 89 mg/dL (ref 0.0–149.0)
VLDL: 17.8 mg/dL (ref 0.0–40.0)

## 2023-09-23 NOTE — Patient Instructions (Signed)
 It was great to see you!  Bring us  your advanced directives at some point Consider RSV vaccine Dermatology referral Let me know if you decide you would like to pursue a heart monitor  Please go to the lab for blood work.   Our office will call you with your results unless you have chosen to receive results via MyChart.  If your blood work is normal we will follow-up each year for physicals and as scheduled for chronic medical problems.  If anything is abnormal we will treat accordingly and get you in for a follow-up.  Take care,  Sebastian Dzik

## 2023-09-23 NOTE — Progress Notes (Signed)
 Phone: [phone]  Subjective:  Patient presents today for their Welcome to Medicare Exam    Preventive Screening-Counseling & Management Vision screen: Failed -- has macular degeneration managed by ophthalmology   Hearing Screening (Inadequate exam)  Method: Audiometry    Right ear  Left ear  Comments: Right ear: Failed Left ear: Failed  Vision Screening   Right eye Left eye Both eyes  Without correction     With correction 20/100 20/40 20/30   Advanced directives: -- has them completed but has not given us  a copy yet  Modifiable Risk Factors/behavioral risk assessment/psychosocial risk assessment Regular exercise: -- trying to get regular exercise of about 40 minutes when home -- strength training/indoor exercise, etc -- especially when home (difficult to fit in when traveling) Diet: eats balanced most of the time, has difficulty when traveling Wt Readings from Last 3 Encounters:  09/23/23 130 lb (59 kg)  07/14/23 132 lb 12.8 oz (60.2 kg)  04/21/23 135 lb (61.2 kg)   Smoking Status: Never Smoker Second Hand Smoking status: No smokers in home currently but as a chlid Alcohol intake: 7 days per week -- 5 oz pour Other substance abuse/illicit drugs: none  Cardiac risk factors:  yes Hyperlipidemia - takes statin; calcium score of 0 in 2023 yes Hypertension - well controlled No diabetes.  Lab Results  Component Value Date   HGBA1C 5.6 09/03/2020   Family History:  Family History  Problem Relation Age of Onset   Arthritis Mother    Heart attack Mother 79   Heart disease Mother    Obesity Mother    Vision loss Mother    Pancreatic cancer Father    Cancer Father    Early death Father    Vision loss Brother    Early death Maternal Grandmother 40       cardiac-related   Heart disease Maternal Grandfather    ADD / ADHD Daughter    Anxiety disorder Daughter    Anxiety disorder Daughter    Anxiety disorder Son    Vision loss Maternal Aunt     Depression  Screen/risk evaluation Risk factors: none.SABRA PHQ2 0     09/23/2023   11:14 AM 09/23/2023   10:19 AM 01/30/2023    2:32 PM 11/21/2022    8:08 AM 09/09/2022    8:54 AM  Depression screen PHQ 2/9  Decreased Interest 0 0 0 0 0  Down, Depressed, Hopeless 0 0 0 0 0  PHQ - 2 Score 0 0 0 0 0    Functional ability and level of safety Mobility assessment:  timed get up and go <12 seconds Activities of Daily Living- Independent in ADLs (toileting, bathing, dressing, transferring, eating) and in IADLs (shopping, housekeeping, managing own medications, and handling finances) Home Safety: Loose rugs (yes), smoke detectors (yes), small pets (no), grab bars (no -- rents home), stairs (no), life-alert system (no) Hearing Difficulties: patient declines but she failed her exam -- she is agreeable to audiology referral Fall Risk: None   Opioid use history:  no long term opioids use Self assessment of health status: good health  Required Immunizations needed today:  none Immunization History  Administered Date(s) Administered   Influenza-Unspecified 01/05/2017, 12/09/2017, 12/08/2018, 12/12/2019, 12/03/2020, 12/09/2021, 12/26/2022   Moderna Sars-Covid-2 Vaccination 03/15/2019, 04/11/2019   PFIZER(Purple Top)SARS-COV-2 Vaccination 02/23/2020   PNEUMOCOCCAL CONJUGATE-20 09/09/2022   Pfizer Covid-19 Vaccine Bivalent Booster 5y-11y 01/19/2021   Pfizer(Comirnaty)Fall Seasonal Vaccine 12 years and older 03/06/2023   Tdap 03/18/2017   Zoster  Recombinant(Shingrix) 09/03/2020, 12/04/2020     There are no preventive care reminders to display for this patient.  Colon cancer screening- UpToDate - 10 year recall due May 2016 Lung Cancer screening- - n/a Skin cancer screening- never -- referral 4. Cervical cancer screening- aged out 5. Breast cancer screening- UpToDate -- annual screen due March 2026  The following were reviewed and entered/updated in epic: Past Medical History:  Diagnosis Date   Allergy  2017   Seasonal started after relocate to [facility]   Arthritis    would like to confirm if I have arthritis   Cataract    Surgery both eyes in March 2022   Depression 2008-09   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   GERD (gastroesophageal reflux disease)    History of chicken pox    Localized osteoporosis without current pathological fracture 11/21/2022   Macular degeneration of both eyes 2016   Patient Active Problem List   Diagnosis Date Noted   Seborrheic dermatitis 09/23/2023   Age-related osteoporosis without current pathological fracture 11/21/2022   Dyslipidemia 09/03/2020   Environmental and seasonal allergies 08/23/2018   Vitamin D  deficiency 08/17/2017   Arthralgia of both hands 08/17/2017   Macular degeneration of both eyes 03/10/2014   Past Surgical History:  Procedure Laterality Date   BREAST BIOPSY Right 06/21/2021   EYE SURGERY  March 2022   Cataract both eyes   HERNIA REPAIR Bilateral 1999   Inguinal    removal of ovarian cyst Right 1995   THROAT SURGERY  2009   Zinkers Diverticulum   VAGINAL DELIVERY  1985, 1987, 1989   Medications- reviewed and updated Current Outpatient Medications  Medication Sig Dispense Refill   acetaminophen  (TYLENOL  8 HOUR ARTHRITIS PAIN) 650 MG CR tablet Take 650 mg by mouth at bedtime.     amLODipine  (NORVASC ) 10 MG tablet Take 1 tablet (10 mg total) by mouth daily. 90 tablet 1   calcium citrate-vitamin D  (CITRACAL+D) 315-200 MG-UNIT tablet Take 1 tablet by mouth daily.     ipratropium (ATROVENT ) 0.03 % nasal spray Place 2 sprays into both nostrils every 12 (twelve) hours. (Patient taking differently: Place 2 sprays into both nostrils as needed.) 30 mL 3   OVER THE COUNTER MEDICATION Take 3 capsules by mouth daily in the afternoon. Pure Incapsulation, Multi vit with vision supplement.     pravastatin  (PRAVACHOL ) 40 MG tablet TAKE 1 TABLET BY MOUTH EVERY DAY 90 tablet 1   No current facility-administered medications  for this visit.    Allergies-reviewed and updated Allergies  Allergen Reactions   Sulfa Antibiotics Nausea Only   Social History   Socioeconomic History   Marital status: Married    Spouse name: Not on file   Number of children: Not on file   Years of education: Not on file   Highest education level: Associate degree: academic program  Occupational History   Not on file  Tobacco Use   Smoking status: Never   Smokeless tobacco: Never  Vaping Use   Vaping status: Never Used  Substance and Sexual Activity   Alcohol use: Yes    Alcohol/week: 5.0 standard drinks of alcohol    Types: 5 Glasses of wine per week    Comment: Glass of wine at dinner - less than 6 ounce pour   Drug use: Never   Sexual activity: Yes    Birth control/protection: Post-menopausal  Other Topics Concern   Not on file  Social History Narrative   From [place]   Moved here in Oct 2017 with husband   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, theatre/television/film   Social Drivers of Health   Financial Resource Strain: Low Risk  (09/22/2023)   Overall Financial Resource Strain (CARDIA)    Difficulty of Paying Living Expenses: Not hard at all  Food Insecurity: No Food Insecurity (09/22/2023)   Hunger Vital Sign    Worried About Running Out of Food in the Last Year: Never true    Ran Out of Food in the Last Year: Never true  Transportation Needs: No Transportation Needs (09/22/2023)   PRAPARE - Administrator, Civil Service (Medical): No    Lack of Transportation (Non-Medical): No  Physical Activity: Insufficiently Active (09/22/2023)   Exercise Vital Sign    Days of Exercise per Week: 4 days    Minutes of Exercise per Session: 30 min  Stress: No Stress Concern Present (09/22/2023)   [employer] of Occupational Health - Occupational Stress Questionnaire    Feeling of Stress: Only a little  Social Connections: Moderately Isolated (09/22/2023)   Social Connection and Isolation Panel     Frequency of Communication with Friends and Family: More than three times a week    Frequency of Social Gatherings with Friends and Family: Patient declined    Attends Religious Services: Never    Database administrator or Organizations: No    Attends Engineer, structural: Not on file    Marital Status: Married     Objective:  BP 122/80 (BP Location: Left Arm, Patient Position: Sitting, Cuff Size: Normal)   Pulse 64   Temp 98.2 F (36.8 C) (Temporal)   Ht 5' 2 (1.575 m)   Wt 130 lb (59 kg)   SpO2 96%   BMI 23.78 kg/m   Gen: NAD, resting comfortably HEENT: Mucous membranes are moist. Oropharynx normal Neck: no thyromegaly CV: RRR no murmurs rubs or gallops Lungs: CTAB no crackles, wheeze, rhonchi Abdomen: soft/nontender/nondistended/normal bowel sounds. No rebound or guarding.  Ext: no edema Skin: warm, dry Neuro: grossly normal, moves all extremities, PERRLA  Assessment and Plan:   Welcome to Medicare exam completed-  Educated, counseled and referred based on above elements Educated, counseled and referred as appropriate for preventative needs Discussed and documented a written plan for preventiative services and screenings with personalized health advice- After Visit Summary was given to patient which included this plan  4. EKG offered - patient would like this done today -- EKG tracing is personally reviewed.  EKG notes NSR.  No acute changes.    Status of chronic or acute concerns   HTN Currently taking amlodipine  10 mg. At home blood pressure readings are: well controlled. Patient denies chest pain, SOB, blurred vision, dizziness, unusual headaches, lower leg swelling. Patient is compliant with medication. Denies excessive caffeine intake, stimulant usage, excessive alcohol intake, or increase in salt consumption.  BP Readings from Last 3 Encounters:  09/23/23 122/80  07/14/23 112/60  04/21/23 (!) 180/76   HLD Currently taking pravastatin  40 mg  daily Tolerating well  Osteoporosis Getting reclast  via endo UpToDate with DEXA  Malaise Has been feeling off Concerns for possible a fib; has multiple friends with this Happens a few times a month; has to sit down a take a few breaths Denies anxiety, chest pain, shortness of breath  Only happens at home; does a lot of traveling  Assessment / plan -- we plan to continue to monitor this; she declines Zio  patch at this time as she has significant travel coming up; she will reach out if needed  Recommended follow up: yearly, sooner if concerns  Future Appointments  Date Time Provider Department Center  07/13/2024  9:00 AM Thapa, [place], MD [facility] None   Lab/Order associations:   ICD-10-CM   1. Welcome to Medicare preventive visit  Z00.00 CBC with Differential/Platelet    Comprehensive metabolic panel with GFR    Lipid panel    EKG 12-Lead    2. Bilateral hearing loss, unspecified hearing loss type  H91.93     3. Encounter for vision screening with abnormal findings  Z01.01     4. Skin cancer screening  Z12.83     5. Dyslipidemia  E78.5     6. Primary hypertension  I10     7. Age-related osteoporosis without current pathological fracture  M81.0       No orders of the defined types were placed in this encounter.  Return precautions advised.  Akisha Sturgill PA-C

## 2023-09-25 ENCOUNTER — Encounter: Admitting: Physician Assistant

## 2023-10-05 ENCOUNTER — Other Ambulatory Visit (HOSPITAL_BASED_OUTPATIENT_CLINIC_OR_DEPARTMENT_OTHER): Payer: Self-pay

## 2023-10-05 ENCOUNTER — Other Ambulatory Visit: Payer: Self-pay | Admitting: Physician Assistant

## 2023-10-05 ENCOUNTER — Encounter: Payer: Self-pay | Admitting: Physician Assistant

## 2023-10-05 ENCOUNTER — Other Ambulatory Visit (HOSPITAL_COMMUNITY): Payer: Self-pay

## 2023-10-05 MED ORDER — PRAVASTATIN SODIUM 40 MG PO TABS
40.0000 mg | ORAL_TABLET | Freq: Every day | ORAL | 1 refills | Status: DC
  Filled 2023-10-05: qty 90, 90d supply, fill #0
  Filled 2024-01-06: qty 90, 90d supply, fill #1

## 2023-10-06 ENCOUNTER — Ambulatory Visit: Attending: Physician Assistant | Admitting: Audiology

## 2023-10-06 DIAGNOSIS — H900 Conductive hearing loss, bilateral: Secondary | ICD-10-CM | POA: Diagnosis not present

## 2023-10-06 NOTE — Procedures (Signed)
  Outpatient Audiology and [facility] [address] [place], [place]  [address] [phone]  AUDIOLOGICAL  EVALUATION  NAME: Robin Stevens     DOB:   June 20, 1955      MRN: [id number]                                                                                     DATE: 10/06/2023     REFERENT: Job Lukes, PA STATUS: Outpatient DIAGNOSIS: conductive hearing loss, bilateral    History: Viha was seen for an audiological evaluation. Reilley reports she recently failed a hearing screening. Dayanira denies concerns regarding her hearing sensitivity. Eevee reports bilateral aural fullness. Jaeli reports intermittent episodes of dizziness. She denies tinnitus and otalgia. Kare reports a history of rubella as a child. Jacque also reports a history of ear tube surgery as a child.   Evaluation:  Otoscopy showed a clear view of the tympanic membranes, bilaterally Tympanometry was attempted however a hermetic seal could not be maintained in both ears.  Audiometric testing was completed using Conventional Audiometry techniques with insert earphones and TDH headphones. Test results are consistent with a mild conductive (250-500 Hz in the left ear and [phone] Hz in the right ear) hearing loss rising to normal hearing sensitivity in both ears. Speech Recognition Thresholds were obtained at 30 dB HL in the right ear and at 30  dB HL in the left ear. Word Recognition Testing was completed at 70 dB HL and Mariela scored 100%, in both ears.    Results:  The test results were reviewed with Dagoberto. Today's test results in the right ear are consistent with a mild conductive hearing loss at [phone] Hz rising to normal hearing sensitivity at 2000-8000 Hz and test results in the left ear are consistent with a mild conductive hearing loss at 250-500 Hz rising to normal hearing sensitivity at 1000-8000 Hz. Ziasia may have hearing and communication difficulty in adverse listening environments. She will benefit from  the use of good communication strategies.   Recommendations: 1.   Monitor hearing sensitivity. Return in 2-3 years for an audiological evaluation.    30 minutes spent testing and counseling on results.   If you have any questions please feel free to contact me at [phone].  Darryle Posey Audiologist, Au.D., CCC-A 10/06/2023  3:57 PM  Cc: Job Lukes, [place]

## 2023-12-16 DIAGNOSIS — H353114 Nonexudative age-related macular degeneration, right eye, advanced atrophic with subfoveal involvement: Secondary | ICD-10-CM | POA: Diagnosis not present

## 2024-01-11 ENCOUNTER — Encounter: Payer: Self-pay | Admitting: Radiology

## 2024-01-19 ENCOUNTER — Encounter: Payer: Self-pay | Admitting: Endocrinology

## 2024-01-19 ENCOUNTER — Other Ambulatory Visit: Payer: Self-pay | Admitting: Endocrinology

## 2024-01-19 ENCOUNTER — Telehealth: Payer: Self-pay | Admitting: Nutrition

## 2024-01-19 NOTE — Telephone Encounter (Signed)
 I have placed order for Reclast  IV infusion for this month.  She can follow-up with me as a scheduled in May 2026.

## 2024-01-19 NOTE — Telephone Encounter (Signed)
 Patient reports that she is in need of another Reclast  infusion, and wanted to know if DR. Thapa could refer her over to the infusion center for this, or does she need an appointment first. Please advise.

## 2024-01-19 NOTE — Progress Notes (Signed)
 Patient needs annual Reclast  5 mg IV infusion, due for November 2025, order placed.

## 2024-01-21 ENCOUNTER — Telehealth: Payer: Self-pay

## 2024-01-21 NOTE — Telephone Encounter (Signed)
 Dr. Mercie, patient will be scheduled as soon as possible.  Auth Submission: NO AUTH NEEDED Site of care: Site of care: [facility] Payer: [employer] Medication & CPT/J Code(s) submitted: Reclast  (Zolendronic acid) J3489 Diagnosis Code:  Route of submission (phone, fax, portal):  Phone # Fax # Auth type: Buy/Bill PB Units/visits requested: 5mg  x 1 dose Reference number:  Approval from: 01/21/24 to 03/09/24

## 2024-02-12 ENCOUNTER — Ambulatory Visit

## 2024-02-18 ENCOUNTER — Ambulatory Visit

## 2024-02-18 VITALS — BP 150/85 | HR 64 | Temp 97.9°F | Resp 16 | Ht 61.0 in | Wt 134.0 lb

## 2024-02-18 DIAGNOSIS — M810 Age-related osteoporosis without current pathological fracture: Secondary | ICD-10-CM

## 2024-02-18 MED ORDER — ZOLEDRONIC ACID 5 MG/100ML IV SOLN
5.0000 mg | Freq: Once | INTRAVENOUS | Status: AC
  Administered 2024-02-18: 5 mg via INTRAVENOUS
  Filled 2024-02-18: qty 100

## 2024-02-18 MED ORDER — ACETAMINOPHEN 325 MG PO TABS
650.0000 mg | ORAL_TABLET | Freq: Once | ORAL | Status: DC
  Filled 2024-02-18: qty 2

## 2024-02-18 MED ORDER — DIPHENHYDRAMINE HCL 25 MG PO CAPS
25.0000 mg | ORAL_CAPSULE | Freq: Once | ORAL | Status: AC
  Administered 2024-02-18: 25 mg via ORAL
  Filled 2024-02-18: qty 1

## 2024-02-18 NOTE — Progress Notes (Signed)
 Diagnosis:  Osteoporosis  Provider:  Lonna Coder MD  Procedure: IV Infusion  IV Type: Peripheral, IV Location: L Antecubital   Reclast  (Zolendronic Acid), Dose: 5 mg  Infusion Start Time: 1204  Infusion Stop Time: 1234  Post Infusion IV Care: Observation period completed and Peripheral IV Discontinued  Discharge: Condition: Good, Destination: Home . AVS Declined  Performed by:  Leita FORBES Miles, LPN

## 2024-03-14 ENCOUNTER — Other Ambulatory Visit (HOSPITAL_COMMUNITY): Payer: Self-pay

## 2024-03-14 ENCOUNTER — Other Ambulatory Visit: Payer: Self-pay | Admitting: Physician Assistant

## 2024-03-14 MED ORDER — AMLODIPINE BESYLATE 10 MG PO TABS
10.0000 mg | ORAL_TABLET | Freq: Every day | ORAL | 1 refills | Status: AC
  Filled 2024-03-14: qty 90, 90d supply, fill #0

## 2024-04-10 ENCOUNTER — Encounter: Payer: Self-pay | Admitting: Physician Assistant

## 2024-04-11 ENCOUNTER — Other Ambulatory Visit (HOSPITAL_COMMUNITY): Payer: Self-pay

## 2024-04-11 MED ORDER — PRAVASTATIN SODIUM 40 MG PO TABS
40.0000 mg | ORAL_TABLET | Freq: Every day | ORAL | 1 refills | Status: AC
  Filled 2024-04-11: qty 90, 90d supply, fill #0

## 2024-07-13 ENCOUNTER — Ambulatory Visit: Admitting: Endocrinology
